# Patient Record
Sex: Female | Born: 1985
Health system: Southern US, Community
[De-identification: ages and names within clinical notes are randomized; demographics above are authoritative.]

## PROBLEM LIST (undated history)

## (undated) ENCOUNTER — Inpatient Hospital Stay (HOSPITAL_COMMUNITY): Payer: Self-pay

## (undated) DIAGNOSIS — D573 Sickle-cell trait: Secondary | ICD-10-CM

## (undated) DIAGNOSIS — O139 Gestational [pregnancy-induced] hypertension without significant proteinuria, unspecified trimester: Secondary | ICD-10-CM

## (undated) DIAGNOSIS — D649 Anemia, unspecified: Secondary | ICD-10-CM

## (undated) DIAGNOSIS — E876 Hypokalemia: Secondary | ICD-10-CM

## (undated) DIAGNOSIS — R87629 Unspecified abnormal cytological findings in specimens from vagina: Secondary | ICD-10-CM

## (undated) HISTORY — PX: NO PAST SURGERIES: SHX2092

## (undated) HISTORY — DX: Unspecified abnormal cytological findings in specimens from vagina: R87.629

---

## 2013-03-17 ENCOUNTER — Other Ambulatory Visit (HOSPITAL_COMMUNITY)
Admission: RE | Admit: 2013-03-17 | Discharge: 2013-03-17 | Disposition: A | Discharge status: Home / Self Care | Payer: PRIVATE HEALTH INSURANCE | Source: Ambulatory Visit | Attending: Obstetrics and Gynecology | Admitting: Obstetrics and Gynecology

## 2013-03-17 DIAGNOSIS — Z113 Encounter for screening for infections with a predominantly sexual mode of transmission: Secondary | ICD-10-CM | POA: Insufficient documentation

## 2013-03-17 DIAGNOSIS — Z01419 Encounter for gynecological examination (general) (routine) without abnormal findings: Secondary | ICD-10-CM | POA: Insufficient documentation

## 2013-03-17 LAB — OB RESULTS CONSOLE HEPATITIS B SURFACE ANTIGEN: Hepatitis B Surface Ag: NEGATIVE

## 2013-03-17 LAB — OB RESULTS CONSOLE ABO/RH: RH Type: POSITIVE

## 2013-03-17 LAB — OB RESULTS CONSOLE GC/CHLAMYDIA
CHLAMYDIA, DNA PROBE: NEGATIVE
Gonorrhea: NEGATIVE

## 2013-03-17 LAB — OB RESULTS CONSOLE HIV ANTIBODY (ROUTINE TESTING): HIV: NONREACTIVE

## 2013-03-17 LAB — OB RESULTS CONSOLE RPR: RPR: NONREACTIVE

## 2013-03-17 LAB — OB RESULTS CONSOLE ANTIBODY SCREEN: Antibody Screen: NEGATIVE

## 2013-03-17 LAB — OB RESULTS CONSOLE RUBELLA ANTIBODY, IGM: Rubella: IMMUNE

## 2013-04-14 ENCOUNTER — Ambulatory Visit (HOSPITAL_COMMUNITY)
Admission: RE | Admit: 2013-04-14 | Discharge: 2013-04-14 | Disposition: A | Discharge status: Home / Self Care | Payer: PRIVATE HEALTH INSURANCE | Source: Ambulatory Visit | Attending: Obstetrics and Gynecology | Admitting: Obstetrics and Gynecology

## 2013-04-14 ENCOUNTER — Other Ambulatory Visit: Payer: Self-pay | Admitting: Obstetrics and Gynecology

## 2013-04-14 DIAGNOSIS — N889 Noninflammatory disorder of cervix uteri, unspecified: Secondary | ICD-10-CM

## 2013-04-14 DIAGNOSIS — Z3689 Encounter for other specified antenatal screening: Secondary | ICD-10-CM | POA: Insufficient documentation

## 2013-04-14 DIAGNOSIS — O209 Hemorrhage in early pregnancy, unspecified: Secondary | ICD-10-CM | POA: Insufficient documentation

## 2013-07-21 ENCOUNTER — Other Ambulatory Visit: Payer: Self-pay | Admitting: Obstetrics and Gynecology

## 2013-07-27 ENCOUNTER — Ambulatory Visit (HOSPITAL_COMMUNITY): Payer: PRIVATE HEALTH INSURANCE

## 2013-07-27 ENCOUNTER — Ambulatory Visit (HOSPITAL_COMMUNITY)
Admission: RE | Admit: 2013-07-27 | Discharge: 2013-07-27 | Disposition: A | Discharge status: Home / Self Care | Payer: PRIVATE HEALTH INSURANCE | Source: Ambulatory Visit | Attending: Obstetrics and Gynecology | Admitting: Obstetrics and Gynecology

## 2013-07-27 DIAGNOSIS — O3660X0 Maternal care for excessive fetal growth, unspecified trimester, not applicable or unspecified: Secondary | ICD-10-CM | POA: Insufficient documentation

## 2013-07-29 NOTE — L&D Delivery Note (Signed)
Delivery Note At 6:50 AM a viable female was delivered via Vaginal, Spontaneous Delivery (Presentation: Left Occiput Anterior).  APGAR: 9, 9; weight .   Placenta status: Intact, Spontaneous Pathology.  Cord: 3 vessels with the following complications: None.  Cord pH: NA  Anesthesia: Epidural  Episiotomy: None Lacerations: None Suture Repair: NA Est. Blood Loss (mL): 250  Mom to postpartum.  Baby to Couplet care / Skin to Skin.  Kimbery Harwood J. 09/14/2013, 7:08 AM

## 2013-09-03 ENCOUNTER — Inpatient Hospital Stay (HOSPITAL_COMMUNITY)
Admission: AD | Admit: 2013-09-03 | Discharge: 2013-09-03 | Disposition: A | Discharge status: Home / Self Care | Payer: Commercial Managed Care - PPO | Source: Ambulatory Visit | Attending: Obstetrics and Gynecology | Admitting: Obstetrics and Gynecology

## 2013-09-03 ENCOUNTER — Encounter (HOSPITAL_COMMUNITY): Payer: Self-pay | Admitting: General Practice

## 2013-09-03 DIAGNOSIS — O139 Gestational [pregnancy-induced] hypertension without significant proteinuria, unspecified trimester: Secondary | ICD-10-CM

## 2013-09-03 DIAGNOSIS — O133 Gestational [pregnancy-induced] hypertension without significant proteinuria, third trimester: Secondary | ICD-10-CM

## 2013-09-03 LAB — CBC
HEMATOCRIT: 33.3 % — AB (ref 36.0–46.0)
HEMOGLOBIN: 11.4 g/dL — AB (ref 12.0–15.0)
MCH: 27.9 pg (ref 26.0–34.0)
MCHC: 34.2 g/dL (ref 30.0–36.0)
MCV: 81.6 fL (ref 78.0–100.0)
Platelets: 175 10*3/uL (ref 150–400)
RBC: 4.08 MIL/uL (ref 3.87–5.11)
RDW: 16.1 % — ABNORMAL HIGH (ref 11.5–15.5)
WBC: 10.7 10*3/uL — ABNORMAL HIGH (ref 4.0–10.5)

## 2013-09-03 LAB — COMPREHENSIVE METABOLIC PANEL
ALBUMIN: 2.7 g/dL — AB (ref 3.5–5.2)
ALK PHOS: 126 U/L — AB (ref 39–117)
ALT: 9 U/L (ref 0–35)
AST: 13 U/L (ref 0–37)
BUN: 5 mg/dL — AB (ref 6–23)
CHLORIDE: 101 meq/L (ref 96–112)
CO2: 23 mEq/L (ref 19–32)
Calcium: 9.1 mg/dL (ref 8.4–10.5)
Creatinine, Ser: 0.58 mg/dL (ref 0.50–1.10)
GFR calc non Af Amer: >90 mL/min (ref >90)
Glucose, Bld: 81 mg/dL (ref 70–99)
POTASSIUM: 3.2 meq/L — AB (ref 3.7–5.3)
Sodium: 136 mEq/L — ABNORMAL LOW (ref 137–147)
Total Bilirubin: 0.5 mg/dL (ref 0.3–1.2)
Total Protein: 6.7 g/dL (ref 6.0–8.3)

## 2013-09-03 LAB — URINALYSIS, DIPSTICK ONLY
BILIRUBIN URINE: NEGATIVE
GLUCOSE, UA: NEGATIVE mg/dL
Hgb urine dipstick: NEGATIVE
KETONES UR: NEGATIVE mg/dL
Nitrite: NEGATIVE
PROTEIN: NEGATIVE mg/dL
Specific Gravity, Urine: <1.005 — ABNORMAL LOW (ref 1.005–1.030)
Urobilinogen, UA: 0.2 mg/dL (ref 0.0–1.0)
pH: 6.5 (ref 5.0–8.0)

## 2013-09-03 LAB — LACTATE DEHYDROGENASE: LDH: 197 U/L (ref 94–250)

## 2013-09-03 LAB — URIC ACID: Uric Acid, Serum: 5 mg/dL (ref 2.4–7.0)

## 2013-09-03 NOTE — MAU Note (Signed)
Pt presents to MAU sent from office to [facility].

## 2013-09-03 NOTE — Progress Notes (Signed)
BP in office 138/98, repeat 140/100.

## 2013-09-03 NOTE — MAU Provider Note (Signed)
History     CSN: [id number]  Arrival date and time: 09/03/13 0927   None     Chief Complaint  Patient presents with  . Hypertension   Hypertension Associated symptoms include headaches (mild). Pertinent negatives include no blurred vision, chest pain or malaise/fatigue.   This is a 28 y.o. female who presents from office with elevated BP.  Ordered to have monitoring and labs. Denies symptoms other than mild headache.    RN Note: Sent from office, BP has been elevated. Had 24hr urine test collected this week,<300 of protein per Dr Dion BodyVarnado. Pt for labs, serial BP and monitoring. occ mild HA, doesn't require meds; no visual changes, epigastric pain or increase in swelling.       OB History   Grav Para Term Preterm Abortions TAB SAB Ect Mult Living   2    1  1          History reviewed. No pertinent past medical history.  History reviewed. No pertinent past surgical history.  History reviewed. No pertinent family history.  History  Substance Use Topics  . Smoking status: Never Smoker   . Smokeless tobacco: Never Used  . Alcohol Use: No    Allergies:  Allergies  Allergen Reactions  . Sulfa Antibiotics Other (See Comments)    Child hood allergy, unknown reaction    Prescriptions prior to admission  Medication Sig Dispense Refill  . calcium carbonate (TUMS - DOSED IN MG ELEMENTAL CALCIUM) 500 MG chewable tablet Chew 1 tablet by mouth 2 (two) times daily as needed for indigestion or heartburn.      . ferrous sulfate 325 (65 FE) MG tablet Take 325 mg by mouth daily with breakfast.      . Prenatal Vit-Fe Fumarate-FA (PRENATAL MULTIVITAMIN) TABS tablet Take 1 tablet by mouth daily at 12 noon.        Review of Systems  Constitutional: Negative for fever, chills and malaise/fatigue.  Eyes: Negative for blurred vision.  Cardiovascular: Negative for chest pain.  Gastrointestinal: Negative for nausea, vomiting and abdominal pain.  Neurological: Positive for headaches  (mild). Negative for dizziness and focal weakness.   Physical Exam   Blood pressure 132/87, pulse 86, temperature 98.3 F (36.8 C), temperature source Oral, resp. rate 18, height 5' (1.524 m), weight 79.379 kg (175 lb), last menstrual period 12/28/2012.  Physical Exam  Constitutional: She is oriented to person, place, and time. She appears well-developed and well-nourished. No distress.  HENT:  Head: Normocephalic.  Neck: Normal range of motion. Neck supple.  Cardiovascular: Normal rate, regular rhythm and normal heart sounds.  Exam reveals no gallop and no friction rub.   No murmur heard. Respiratory: Effort normal. No respiratory distress. She has no wheezes. She has no rales.  GI: Soft. She exhibits no distension. There is no tenderness. There is no rebound and no guarding.  Musculoskeletal: Normal range of motion. She exhibits no edema.  Neurological: She is alert and oriented to person, place, and time. She has normal reflexes. She displays normal reflexes. She exhibits normal muscle tone (no clonus).  Skin: Skin is warm and dry.  Psychiatric: She has a normal mood and affect.    MAU Course  Procedures  MDM Filed Vitals:   09/03/13 1021 09/03/13 1031 09/03/13 1041 09/03/13 1051  BP: 139/91 126/88 126/99 123/78  Pulse: 82 76 87 74  Temp:      TempSrc:      Resp:      Height:  Weight:       Results for orders placed during the hospital encounter of 09/03/13 (from the past 24 hour(s))  URINALYSIS, DIPSTICK ONLY     Status: Abnormal   Collection Time    09/03/13  9:40 AM      Result Value Range   Specific Gravity, Urine <1.005 (*) 1.005 - 1.030   pH 6.5  5.0 - 8.0   Glucose, UA NEGATIVE  NEGATIVE mg/dL   Hgb urine dipstick NEGATIVE  NEGATIVE   Bilirubin Urine NEGATIVE  NEGATIVE   Ketones, ur NEGATIVE  NEGATIVE mg/dL   Protein, ur NEGATIVE  NEGATIVE mg/dL   Urobilinogen, UA 0.2  0.0 - 1.0 mg/dL   Nitrite NEGATIVE  NEGATIVE   Leukocytes, UA TRACE (*) NEGATIVE   CBC     Status: Abnormal   Collection Time    09/03/13 10:26 AM      Result Value Range   WBC 10.7 (*) 4.0 - 10.5 K/uL   RBC 4.08  3.87 - 5.11 MIL/uL   Hemoglobin 11.4 (*) 12.0 - 15.0 g/dL   HCT 56.2 (*) 13.0 - 86.5 %   MCV 81.6  78.0 - 100.0 fL   MCH 27.9  26.0 - 34.0 pg   MCHC 34.2  30.0 - 36.0 g/dL   RDW 78.4 (*) 69.6 - 29.5 %   Platelets 175  150 - 400 K/uL  COMPREHENSIVE METABOLIC PANEL     Status: Abnormal   Collection Time    09/03/13 10:26 AM      Result Value Range   Sodium 136 (*) 137 - 147 mEq/L   Potassium 3.2 (*) 3.7 - 5.3 mEq/L   Chloride 101  96 - 112 mEq/L   CO2 23  19 - 32 mEq/L   Glucose, Bld 81  70 - 99 mg/dL   BUN 5 (*) 6 - 23 mg/dL   Creatinine, Ser 2.84  0.50 - 1.10 mg/dL   Calcium 9.1  8.4 - 13.2 mg/dL   Total Protein 6.7  6.0 - 8.3 g/dL   Albumin 2.7 (*) 3.5 - 5.2 g/dL   AST 13  0 - 37 U/L   ALT 9  0 - 35 U/L   Alkaline Phosphatase 126 (*) 39 - 117 U/L   Total Bilirubin 0.5  0.3 - 1.2 mg/dL   GFR calc non Af Amer >90  >90 mL/min   GFR calc Af Amer >90  >90 mL/min  LACTATE DEHYDROGENASE     Status: None   Collection Time    09/03/13 10:26 AM      Result Value Range   LDH 197  94 - 250 U/L  URIC ACID     Status: None   Collection Time    09/03/13 10:26 AM      Result Value Range   Uric Acid, Serum 5.0  2.4 - 7.0 mg/dL      Assessment and Plan  A:  [facility] at [redacted]w[redacted]d        Gestational hypertension, labile        No evidence of acute preeclampsia  P:  Discussed with Dr Dion Body       May need to do 24 hr urine this weekend       Preeclampsia precautions       Followup inoffice  [facility] 09/03/2013, 10:23 AM

## 2013-09-03 NOTE — MAU Note (Addendum)
Sent from office, BP has been elevated. Had 24hr urine test collected this week,<300 of protein per Dr Dion BodyVarnado.  Pt for labs, serial BP and monitoring. occ mild HA, doesn't require meds; no visual changes, epigastric pain or increase in swelling.

## 2013-09-03 NOTE — Discharge Instructions (Signed)
Hypertension During Pregnancy Hypertension is also called high blood pressure. It can occur at any time in life and during pregnancy. When you have hypertension, there is extra pressure inside your blood vessels that carry blood from the heart to the rest of your body (arteries). Hypertension during pregnancy can cause problems for you and your baby. Your baby might not weigh as much as it should at birth or might be born early (premature). Very bad cases of hypertension during pregnancy can be life threatening.  Different types of hypertension can occur during pregnancy.   Chronic hypertension. This happens when a woman has hypertension before pregnancy and it continues during pregnancy.  Gestational hypertension. This is when hypertension develops during pregnancy.  Preeclampsia or toxemia of pregnancy. This is a very serious type of hypertension that develops only during pregnancy. It is a disease that affects the whole body (systemic) and can be very dangerous for both mother and baby.  Gestational hypertension and preeclampsia usually go away after your baby is born. Blood pressure generally stabilizes within 6 weeks. Women who have hypertension during pregnancy have a greater chance of developing hypertension later in life or with future pregnancies. RISK FACTORS Some factors make you more likely to develop hypertension during pregnancy. Risk factors include:  Having hypertension before pregnancy.  Having hypertension during a previous pregnancy.  Being overweight.  Being older than 40.  Being pregnant with more than one baby (multiples).  Having diabetes or kidney problems. SIGNS AND SYMPTOMS Chronic and gestational hypertension rarely cause symptoms. Preeclampsia has symptoms, which may include:  Increased protein in your urine. Your health care provider will check for this at every prenatal visit.  Swelling of your hands and face.  Rapid weight gain.  Headaches.  Visual  changes.  Being bothered by light.  Abdominal pain, especially in the right upper area.  Chest pain.  Shortness of breath.  Increased reflexes.  Seizures. Seizures occur with a more severe form of preeclampsia, called eclampsia. DIAGNOSIS   You may be diagnosed with hypertension during a regular prenatal exam. At each visit, tests may include:  Blood pressure checks.  A urine test to check for protein in your urine.  The type of hypertension you are diagnosed with depends on when you developed it. It also depends on your specific blood pressure reading.  Developing hypertension before 20 weeks of pregnancy is consistent with chronic hypertension.  Developing hypertension after 20 weeks of pregnancy is consistent with gestational hypertension.  Hypertension with increased urinary protein is diagnosed as preeclampsia.  Blood pressure measurements that stay above 160 systolic or 110 diastolic are a sign of severe preeclampsia. TREATMENT Treatment for hypertension during pregnancy varies. Treatment depends on the type of hypertension and how serious it is.  If you take medicine for chronic hypertension, you may need to switch medicines.  Drugs called ACE inhibitors should not be taken during pregnancy.  Low-dose aspirin may be suggested for women who have risk factors for preeclampsia.  If you have gestational hypertension, you may need to take a blood pressure medicine that is safe during pregnancy. Your health care provider will recommend the appropriate medicine.  If you have severe preeclampsia, you may need to be in the hospital. Health care providers will watch you and the baby very closely. You also may need to take medicine (magnesium sulfate) to prevent seizures and lower blood pressure.  Sometimes an early delivery is needed. This may be the case if the condition worsens. It would   be done to protect you and the baby. The only cure for preeclampsia is delivery. HOME  CARE INSTRUCTIONS  Schedule and keep all of your regular appointments for prenatal care.  Only take over-the-counter or prescription medicines as directed by your health care provider. Tell your health care provider about all medicines you take.  Eat as little salt as possible.  Get regular exercise.  Do not drink alcohol.  Do not use tobacco products.  Do not drink products with caffeine.  Lie on your left side when resting. SEEK IMMEDIATE MEDICAL CARE IF:  You have severe abdominal pain.  You have sudden swelling in the hands, ankles, or face.  You gain 4 pounds (1.8 kg) or more in 1 week.  You vomit repeatedly.  You have vaginal bleeding.  You do not feel the baby moving as much.  You have a headache.  You have blurred or double vision.  You have muscle twitching or spasms.  You have shortness of breath.  You have blue fingernails and lips.  You have blood in your urine. MAKE SURE YOU:  Understand these instructions.  Will watch your condition.  Will get help right away if you are not doing well or get worse. Document Released: 04/02/2011 Document Revised: 05/05/2013 Document Reviewed: 02/11/2013 [employer] Patient Information 2014 [employer]  

## 2013-09-04 ENCOUNTER — Ambulatory Visit (HOSPITAL_COMMUNITY)
Admission: AD | Admit: 2013-09-04 | Discharge: 2013-09-04 | Disposition: A | Discharge status: Home / Self Care | Payer: Commercial Managed Care - PPO | Source: Ambulatory Visit | Attending: Obstetrics and Gynecology | Admitting: Obstetrics and Gynecology

## 2013-09-04 DIAGNOSIS — O26839 Pregnancy related renal disease, unspecified trimester: Secondary | ICD-10-CM | POA: Insufficient documentation

## 2013-09-05 ENCOUNTER — Other Ambulatory Visit (HOSPITAL_COMMUNITY): Payer: Self-pay | Admitting: *Deleted

## 2013-09-05 LAB — PROTEIN, URINE, 24 HOUR
Collection Interval-UPROT: 24 hours
PROTEIN 24H UR: 72 mg/d (ref 50–100)
PROTEIN, URINE: 3 mg/dL
Urine Total Volume-UPROT: 2400 mL

## 2013-09-06 ENCOUNTER — Encounter (HOSPITAL_COMMUNITY): Payer: Self-pay

## 2013-09-06 ENCOUNTER — Other Ambulatory Visit: Payer: Self-pay | Admitting: Obstetrics and Gynecology

## 2013-09-06 ENCOUNTER — Inpatient Hospital Stay (HOSPITAL_COMMUNITY)
Admission: AD | Admit: 2013-09-06 | Discharge: 2013-09-06 | Disposition: A | Discharge status: Home / Self Care | Payer: Commercial Managed Care - PPO | Source: Ambulatory Visit | Attending: Obstetrics and Gynecology | Admitting: Obstetrics and Gynecology

## 2013-09-06 ENCOUNTER — Ambulatory Visit (HOSPITAL_COMMUNITY)
Admission: RE | Admit: 2013-09-06 | Discharge: 2013-09-06 | Disposition: A | Discharge status: Home / Self Care | Payer: Commercial Managed Care - PPO | Source: Ambulatory Visit | Attending: Obstetrics and Gynecology | Admitting: Obstetrics and Gynecology

## 2013-09-06 DIAGNOSIS — O288 Other abnormal findings on antenatal screening of mother: Secondary | ICD-10-CM

## 2013-09-06 DIAGNOSIS — O289 Unspecified abnormal findings on antenatal screening of mother: Secondary | ICD-10-CM | POA: Insufficient documentation

## 2013-09-06 DIAGNOSIS — O99019 Anemia complicating pregnancy, unspecified trimester: Secondary | ICD-10-CM | POA: Insufficient documentation

## 2013-09-06 DIAGNOSIS — O139 Gestational [pregnancy-induced] hypertension without significant proteinuria, unspecified trimester: Secondary | ICD-10-CM | POA: Insufficient documentation

## 2013-09-06 DIAGNOSIS — O133 Gestational [pregnancy-induced] hypertension without significant proteinuria, third trimester: Secondary | ICD-10-CM

## 2013-09-06 DIAGNOSIS — D573 Sickle-cell trait: Secondary | ICD-10-CM | POA: Insufficient documentation

## 2013-09-06 HISTORY — DX: Gestational (pregnancy-induced) hypertension without significant proteinuria, unspecified trimester: O13.9

## 2013-09-06 HISTORY — DX: Sickle-cell trait: D57.3

## 2013-09-06 HISTORY — DX: Anemia, unspecified: D64.9

## 2013-09-06 HISTORY — DX: Hypokalemia: E87.6

## 2013-09-06 NOTE — MAU Note (Signed)
Sent from [facility]FM, tracing non-reactive; BPP 6/8

## 2013-09-06 NOTE — Discharge Instructions (Signed)
Hypertension During Pregnancy °Hypertension is also called high blood pressure. It can occur at any time in life and during pregnancy. When you have hypertension, there is extra pressure inside your blood vessels that carry blood from the heart to the rest of your body (arteries). Hypertension during pregnancy can cause problems for you and your baby. Your baby might not weigh as much as it should at birth or might be born early (premature). Very bad cases of hypertension during pregnancy can be life threatening.  °Different types of hypertension can occur during pregnancy.  °· Chronic hypertension. This happens when a woman has hypertension before pregnancy and it continues during pregnancy. °· Gestational hypertension. This is when hypertension develops during pregnancy. °· Preeclampsia or toxemia of pregnancy. This is a very serious type of hypertension that develops only during pregnancy. It is a disease that affects the whole body (systemic) and can be very dangerous for both mother and baby.   °Gestational hypertension and preeclampsia usually go away after your baby is born. Blood pressure generally stabilizes within 6 weeks. Women who have hypertension during pregnancy have a greater chance of developing hypertension later in life or with future pregnancies. °RISK FACTORS °Some factors make you more likely to develop hypertension during pregnancy. Risk factors include: °· Having hypertension before pregnancy. °· Having hypertension during a previous pregnancy. °· Being overweight. °· Being older than 40. °· Being pregnant with more than one baby (multiples). °· Having diabetes or kidney problems. °SIGNS AND SYMPTOMS °Chronic and gestational hypertension rarely cause symptoms. Preeclampsia has symptoms, which may include: °· Increased protein in your urine. Your health care provider will check for this at every prenatal visit. °· Swelling of your hands and face. °· Rapid weight gain. °· Headaches. °· Visual  changes. °· Being bothered by light. °· Abdominal pain, especially in the right upper area. °· Chest pain. °· Shortness of breath. °· Increased reflexes. °· Seizures. Seizures occur with a more severe form of preeclampsia, called eclampsia. °DIAGNOSIS  °· You may be diagnosed with hypertension during a regular prenatal exam. At each visit, tests may include: °· Blood pressure checks. °· A urine test to check for protein in your urine. °· The type of hypertension you are diagnosed with depends on when you developed it. It also depends on your specific blood pressure reading. °· Developing hypertension before 20 weeks of pregnancy is consistent with chronic hypertension. °· Developing hypertension after 20 weeks of pregnancy is consistent with gestational hypertension. °· Hypertension with increased urinary protein is diagnosed as preeclampsia. °· Blood pressure measurements that stay above 160 systolic or 110 diastolic are a sign of severe preeclampsia. °TREATMENT °Treatment for hypertension during pregnancy varies. Treatment depends on the type of hypertension and how serious it is. °· If you take medicine for chronic hypertension, you may need to switch medicines. °· Drugs called ACE inhibitors should not be taken during pregnancy. °· Low-dose aspirin may be suggested for women who have risk factors for preeclampsia. °· If you have gestational hypertension, you may need to take a blood pressure medicine that is safe during pregnancy. Your health care provider will recommend the appropriate medicine. °· If you have severe preeclampsia, you may need to be in the hospital. Health care providers will watch you and the baby very closely. You also may need to take medicine (magnesium sulfate) to prevent seizures and lower blood pressure. °· Sometimes an early delivery is needed. This may be the case if the condition worsens. It would   be done to protect you and the baby. The only cure for preeclampsia is delivery. HOME  CARE INSTRUCTIONS  Schedule and keep all of your regular appointments for prenatal care.  Only take over-the-counter or prescription medicines as directed by your health care provider. Tell your health care provider about all medicines you take.  Eat as little salt as possible.  Get regular exercise.  Do not drink alcohol.  Do not use tobacco products.  Do not drink products with caffeine.  Lie on your left side when resting. SEEK IMMEDIATE MEDICAL CARE IF:  You have severe abdominal pain.  You have sudden swelling in the hands, ankles, or face.  You gain 4 pounds (1.8 kg) or more in 1 week.  You vomit repeatedly.  You have vaginal bleeding.  You do not feel the baby moving as much.  You have a headache.  You have blurred or double vision.  You have muscle twitching or spasms.  You have shortness of breath.  You have blue fingernails and lips.  You have blood in your urine. MAKE SURE YOU:  Understand these instructions.  Will watch your condition.  Will get help right away if you are not doing well or get worse. Document Released: 04/02/2011 Document Revised: 05/05/2013 Document Reviewed: 02/11/2013 [facility] Patient Information 2014 [place], [place].   Fetal Movement Counts Patient Name: __________________________________________________ Patient Due Date: ____________________ Performing a fetal movement count is highly recommended in high-risk pregnancies, but it is good for every pregnant woman to do. Your caregiver may ask you to start counting fetal movements at 28 weeks of the pregnancy. Fetal movements often increase:  After eating a full meal.  After physical activity.  After eating or drinking something sweet or cold.  At rest. Pay attention to when you feel the baby is most active. This will help you notice a pattern of your baby's sleep and wake cycles and what factors contribute to an increase in fetal movement. It is important to perform a  fetal movement count at the same time each day when your baby is normally most active.  HOW TO COUNT FETAL MOVEMENTS 1. Find a quiet and comfortable area to sit or lie down on your left side. Lying on your left side provides the best blood and oxygen circulation to your baby. 2. Write down the day and time on a sheet of paper or in a journal. 3. Start counting kicks, flutters, swishes, rolls, or jabs in a 2 hour period. You should feel at least 10 movements within 2 hours. 4. If you do not feel 10 movements in 2 hours, wait 2 3 hours and count again. Look for a change in the pattern or not enough counts in 2 hours. SEEK MEDICAL CARE IF:  You feel less than 10 counts in 2 hours, tried twice.  There is no movement in over an hour.  The pattern is changing or taking longer each day to reach 10 counts in 2 hours.  You feel the baby is not moving as he or she usually does. Date: ____________ Movements: ____________ Start time: ____________ Doreatha MartinFinish time: ____________  Date: ____________ Movements: ____________ Start time: ____________ Doreatha MartinFinish time: ____________ Date: ____________ Movements: ____________ Start time: ____________ Doreatha MartinFinish time: ____________ Date: ____________ Movements: ____________ Start time: ____________ Doreatha MartinFinish time: ____________ Date: ____________ Movements: ____________ Start time: ____________ Doreatha MartinFinish time: ____________ Date: ____________ Movements: ____________ Start time: ____________ Doreatha MartinFinish time: ____________ Date: ____________ Movements: ____________ Start time: ____________ Doreatha MartinFinish time: ____________ Date: ____________ Movements: ____________ Start  time: ____________ Doreatha MartinFinish time: ____________  Date: ____________ Movements: ____________ Start time: ____________ Doreatha MartinFinish time: ____________ Date: ____________ Movements: ____________ Start time: ____________ Doreatha MartinFinish time: ____________ Date: ____________ Movements: ____________ Start time: ____________ Doreatha MartinFinish time: ____________ Date:  ____________ Movements: ____________ Start time: ____________ Doreatha MartinFinish time: ____________ Date: ____________ Movements: ____________ Start time: ____________ Doreatha MartinFinish time: ____________ Date: ____________ Movements: ____________ Start time: ____________ Doreatha MartinFinish time: ____________ Date: ____________ Movements: ____________ Start time: ____________ Doreatha MartinFinish time: ____________  Date: ____________ Movements: ____________ Start time: ____________ Doreatha MartinFinish time: ____________ Date: ____________ Movements: ____________ Start time: ____________ Doreatha MartinFinish time: ____________ Date: ____________ Movements: ____________ Start time: ____________ Doreatha MartinFinish time: ____________ Date: ____________ Movements: ____________ Start time: ____________ Doreatha MartinFinish time: ____________ Date: ____________ Movements: ____________ Start time: ____________ Doreatha MartinFinish time: ____________ Date: ____________ Movements: ____________ Start time: ____________ Doreatha MartinFinish time: ____________ Date: ____________ Movements: ____________ Start time: ____________ Doreatha MartinFinish time: ____________  Date: ____________ Movements: ____________ Start time: ____________ Doreatha MartinFinish time: ____________ Date: ____________ Movements: ____________ Start time: ____________ Doreatha MartinFinish time: ____________ Date: ____________ Movements: ____________ Start time: ____________ Doreatha MartinFinish time: ____________ Date: ____________ Movements: ____________ Start time: ____________ Doreatha MartinFinish time: ____________ Date: ____________ Movements: ____________ Start time: ____________ Doreatha MartinFinish time: ____________ Date: ____________ Movements: ____________ Start time: ____________ Doreatha MartinFinish time: ____________ Date: ____________ Movements: ____________ Start time: ____________ Doreatha MartinFinish time: ____________  Date: ____________ Movements: ____________ Start time: ____________ Doreatha MartinFinish time: ____________ Date: ____________ Movements: ____________ Start time: ____________ Doreatha MartinFinish time: ____________ Date: ____________ Movements: ____________ Start  time: ____________ Doreatha MartinFinish time: ____________ Date: ____________ Movements: ____________ Start time: ____________ Doreatha MartinFinish time: ____________ Date: ____________ Movements: ____________ Start time: ____________ Doreatha MartinFinish time: ____________ Date: ____________ Movements: ____________ Start time: ____________ Doreatha MartinFinish time: ____________ Date: ____________ Movements: ____________ Start time: ____________ Doreatha MartinFinish time: ____________  Date: ____________ Movements: ____________ Start time: ____________ Doreatha MartinFinish time: ____________ Date: ____________ Movements: ____________ Start time: ____________ Doreatha MartinFinish time: ____________ Date: ____________ Movements: ____________ Start time: ____________ Doreatha MartinFinish time: ____________ Date: ____________ Movements: ____________ Start time: ____________ Doreatha MartinFinish time: ____________ Date: ____________ Movements: ____________ Start time: ____________ Doreatha MartinFinish time: ____________ Date: ____________ Movements: ____________ Start time: ____________ Doreatha MartinFinish time: ____________ Date: ____________ Movements: ____________ Start time: ____________ Doreatha MartinFinish time: ____________  Date: ____________ Movements: ____________ Start time: ____________ Doreatha MartinFinish time: ____________ Date: ____________ Movements: ____________ Start time: ____________ Doreatha MartinFinish time: ____________ Date: ____________ Movements: ____________ Start time: ____________ Doreatha MartinFinish time: ____________ Date: ____________ Movements: ____________ Start time: ____________ Doreatha MartinFinish time: ____________ Date: ____________ Movements: ____________ Start time: ____________ Doreatha MartinFinish time: ____________ Date: ____________ Movements: ____________ Start time: ____________ Doreatha MartinFinish time: ____________ Date: ____________ Movements: ____________ Start time: ____________ Doreatha MartinFinish time: ____________  Date: ____________ Movements: ____________ Start time: ____________ Doreatha MartinFinish time: ____________ Date: ____________ Movements: ____________ Start time: ____________ Doreatha MartinFinish time:  ____________ Date: ____________ Movements: ____________ Start time: ____________ Doreatha MartinFinish time: ____________ Date: ____________ Movements: ____________ Start time: ____________ Doreatha MartinFinish time: ____________ Date: ____________ Movements: ____________ Start time: ____________ Doreatha MartinFinish time: ____________ Date: ____________ Movements: ____________ Start time: ____________ Doreatha MartinFinish time: ____________ Document Released: 08/14/2006 Document Revised: 07/01/2012 Document Reviewed: 05/11/2012 [employer] Patient Information 2014 [place], [place].

## 2013-09-06 NOTE — MAU Note (Signed)
Pt states sent from office for NRNST, BPP 6/8. Denies bleeding or lof. Has had issues with htn w/pregnancy only. Was seen in MAU a week ago for PIH eval.

## 2013-09-06 NOTE — MAU Provider Note (Signed)
Chief Complaint:  non-reactive fetal heart    First Provider Initiated Contact with Patient 09/06/13 1648      HPI: Linda Cole is a 28 y.o. G2P0010 at [redacted]w[redacted]d who presents from [place] to maternity admissions due to 6/10 BPP. Being followed for GHTN with normal preE labs 09/03/13. Had NR NST in office today. Had normal 24 hour urine (pro 72mg ) 2 days ago.  Denies H/A, visual disturbance or epigastric pain. Denies contractions, leakage of fluid or vaginal bleeding. Good fetal movement.   Pregnancy Course: Essentially uncomplicated except as noted above.   First tri BP baseline 114/76.   Past Medical History: Past Medical History  Diagnosis Date  . Pregnancy induced hypertension   . Anemia   . Hypokalemia   . Sickle cell trait     Past obstetric history: OB History  Gravida Para Term Preterm AB SAB TAB Ectopic Multiple Living  2    1 1         # Outcome Date GA Lbr Len/2nd Weight Sex Delivery Anes PTL Lv  2 CUR           1 SAB 08/2011              Past Surgical History: Past Surgical History  Procedure Laterality Date  . No past surgeries       Family History: History reviewed. No pertinent family history.  Social History: History  Substance Use Topics  . Smoking status: Never Smoker   . Smokeless tobacco: Never Used  . Alcohol Use: No    Allergies:  Allergies  Allergen Reactions  . Sulfa Antibiotics Other (See Comments)    Child hood allergy, unknown reaction    Meds:  Prescriptions prior to admission  Medication Sig Dispense Refill  . calcium carbonate (TUMS - DOSED IN MG ELEMENTAL CALCIUM) 500 MG chewable tablet Chew 1 tablet by mouth 2 (two) times daily as needed for indigestion or heartburn.      . ferrous sulfate 325 (65 FE) MG tablet Take 325 mg by mouth daily with breakfast.      . Prenatal Vit-Fe Fumarate-FA (PRENATAL MULTIVITAMIN) TABS tablet Take 1 tablet by mouth daily at 12 noon.        ROS: Pertinent findings in history of present  illness.  Physical Exam  Blood pressure 136/87, pulse 80, temperature 98.2 F (36.8 C), temperature source Oral, resp. rate 18, height 5' (1.524 m), last menstrual period 12/28/2012, SpO2 99.00%. GENERAL: Well-developed, well-nourished female in no acute distress.     FHT:  Baseline 140 , moderate variability, accelerations present, no decelerations Contractions: rare, mild   Labs: No results found for this or any previous visit (from the past 24 hour(s)).  Imaging:  US Fetal Bpp W/o Non Stress MFM ultrasound   Indication: 28 yr old G2P0010 at [redacted]w[redacted]d with gestational hypertension for BPP secondary to nonreactive NST. Remote read.   Findings: 1. Single intrauterine pregnancy. 2. Posterior placenta without evidence of previa. 3. Normal amniotic fluid volume. 4. BPP is 6/8 (-2 for breathing).   Recommendations: 1. BPP overall is 6/10 (had nonreactive NST in her doctor's office): - patient was sent to L&D for prolonged monitoring - if fetal tracing is reassuring recommend repeat BPP in 6 hours 2. Gestational hypertension: - recommend close monitoring of BPs - recommend delivery at 37 weeks or sooner if clinically indicated or meets criteria for severe gestational hypertension or severe preeclampsia - recommend continue antenatal testing   Results discussed with Dr. Dion Body.  Linda FosterKristen Quinn, MD       MAU Course: Dr. Dion BodyVarnado here to see pt   Assessment: 1. Gestational hypertension w/o significant proteinuria in 3rd trimester     Plan: Per MD  Linda Cole, CNM 09/06/2013 4:52 PM

## 2013-09-06 NOTE — Progress Notes (Signed)
MFM ultrasound  Indication: 28 yr old G2P0010 at 4855w0d with gestational hypertension for BPP secondary to nonreactive NST. Remote read.  Findings: 1. Single intrauterine pregnancy. 2. Posterior placenta without evidence of previa. 3. Normal amniotic fluid volume. 4. BPP is 6/8 (-2 for breathing).  Recommendations: 1. BPP overall is 6/10 (had nonreactive NST in her doctor's office): - patient was sent to L&D for prolonged monitoring - if fetal tracing is reassuring recommend repeat BPP in 6 hours 2. Gestational hypertension: - recommend close monitoring of BPs - recommend delivery at 37 weeks or sooner if clinically indicated or meets criteria for severe gestational hypertension or severe preeclampsia - recommend continue antenatal testing  Results discussed with Dr. Dion BodyVarnado.   Eulis FosterKristen Kerston Landeck, MD

## 2013-09-07 ENCOUNTER — Other Ambulatory Visit: Payer: Self-pay | Admitting: Obstetrics and Gynecology

## 2013-09-09 ENCOUNTER — Telehealth (HOSPITAL_COMMUNITY): Payer: Self-pay | Admitting: *Deleted

## 2013-09-09 ENCOUNTER — Encounter (HOSPITAL_COMMUNITY): Payer: Self-pay | Admitting: *Deleted

## 2013-09-09 ENCOUNTER — Inpatient Hospital Stay (HOSPITAL_COMMUNITY)
Admission: AD | Admit: 2013-09-09 | Discharge: 2013-09-09 | Disposition: A | Discharge status: Home / Self Care | Payer: Commercial Managed Care - PPO | Source: Ambulatory Visit | Attending: Obstetrics & Gynecology | Admitting: Obstetrics & Gynecology

## 2013-09-09 DIAGNOSIS — D571 Sickle-cell disease without crisis: Secondary | ICD-10-CM | POA: Insufficient documentation

## 2013-09-09 DIAGNOSIS — E876 Hypokalemia: Secondary | ICD-10-CM | POA: Insufficient documentation

## 2013-09-09 DIAGNOSIS — O139 Gestational [pregnancy-induced] hypertension without significant proteinuria, unspecified trimester: Secondary | ICD-10-CM | POA: Insufficient documentation

## 2013-09-09 DIAGNOSIS — O133 Gestational [pregnancy-induced] hypertension without significant proteinuria, third trimester: Secondary | ICD-10-CM

## 2013-09-09 DIAGNOSIS — O99019 Anemia complicating pregnancy, unspecified trimester: Secondary | ICD-10-CM | POA: Insufficient documentation

## 2013-09-09 LAB — URINALYSIS, ROUTINE W REFLEX MICROSCOPIC
Bilirubin Urine: NEGATIVE
GLUCOSE, UA: NEGATIVE mg/dL
HGB URINE DIPSTICK: NEGATIVE
Ketones, ur: NEGATIVE mg/dL
LEUKOCYTES UA: NEGATIVE
Nitrite: NEGATIVE
PH: 6 (ref 5.0–8.0)
Protein, ur: NEGATIVE mg/dL
SPECIFIC GRAVITY, URINE: 1.01 (ref 1.005–1.030)
Urobilinogen, UA: 0.2 mg/dL (ref 0.0–1.0)

## 2013-09-09 NOTE — Discharge Instructions (Signed)
Hypertension During Pregnancy °Hypertension is also called high blood pressure. Blood pressure moves blood in your body. Sometimes, the force that moves the blood becomes too strong. When you are pregnant, this condition should be watched carefully. It can cause problems for you and your baby. °HOME CARE  °· Make and keep all of your doctor visits. °· Take medicine as told by your doctor. Tell your doctor about all medicines you take. °· Eat very little salt. °· Exercise regularly. °· Do not drink alcohol. °· Do not smoke. °· Do not have drinks with caffeine. °· Lie on your left side when resting. °GET HELP RIGHT AWAY IF: °· You have bad belly (abdominal) pain. °· You have sudden puffiness (swelling) in the hands, ankles, or face. °· You gain 4 pounds (1.8 kilograms) or more in 1 week. °· You throw up (vomit) repeatedly. °· You have bleeding from the vagina. °· You do not feel the baby moving as much. °· You have a headache. °· You have blurred or double vision. °· You have muscle twitching or spasms. °· You have shortness of breath. °· You have blue fingernails and lips. °· You have blood in your pee (urine). °MAKE SURE YOU: °· Understand these instructions. °· Will watch your condition. °· Will get help right away if you are not doing well or get worse. °Document Released: 08/17/2010 Document Revised: 05/05/2013 Document Reviewed: 02/11/2013 °ExitCare® Patient Information ©2014 [employer]. ° °Preeclampsia and Eclampsia °Preeclampsia is a condition of high blood pressure during pregnancy. It can happen at 20 weeks or later in pregnancy. If high blood pressure occurs in the second half of pregnancy with no other symptoms, it is called gestational hypertension and goes away after the baby is born. If any of the symptoms listed below develop with gestational hypertension, it is then called preeclampsia. Eclampsia (convulsions) may follow preeclampsia. This is one of the reasons for regular prenatal checkups. Early  diagnosis and treatment are very important to prevent eclampsia. °CAUSES  °There is no known cause of preeclampsia/eclampsia in pregnancy. There are several known conditions that may put the pregnant woman at risk, such as: °· The first pregnancy. °· Having preeclampsia in a past pregnancy. °· Having lasting (chronic) high blood pressure. °· Having multiples (twins, triplets). °· Being age 35 or older. °· African American ethnic background. °· Having kidney disease or diabetes. °· Medical conditions such as lupus or blood diseases. °· Being overweight (obese). °SYMPTOMS  °· High blood pressure. °· Headaches. °· Sudden weight gain. °· Swelling of hands, face, legs, and feet. °· Protein in the urine. °· Feeling sick to your stomach (nauseous) and throwing up (vomiting). °· Vision problems (blurred or double vision). °· Numbness in the face, arms, legs, and feet. °· Dizziness. °· Slurred speech. °· Preeclampsia can cause growth retardation in the fetus. °· Separation (abruption) of the placenta. °· Not enough fluid in the amniotic sac (oligohydramnios). °· Sensitivity to bright lights. °· Belly (abdominal) pain. °DIAGNOSIS  °If protein is found in the urine in the second half of pregnancy, this is considered preeclampsia. Other symptoms mentioned above may also be present. °TREATMENT  °It is necessary to treat this. °· Your caregiver may prescribe bed rest early in this condition. Plenty of rest and salt restriction may be all that is needed. °· Medicines may be necessary to lower blood pressure if the condition does not respond to more conservative measures. °· In more severe cases, hospitalization may be needed: °· For treatment of   blood pressure. °· To control fluid retention. °· To monitor the baby to see if the condition is causing harm to the baby. °· Hospitalization is the best way to treat the first sign of preeclampsia. This is so the mother and baby can be watched closely and blood tests can be done  effectively and correctly. °· If the condition becomes severe, it may be necessary to induce labor or to remove the infant by surgical means (cesarean section). The best cure for preeclampsia/eclampsia is to deliver the baby. °Preeclampsia and eclampsia involve risks to mother and infant. Your caregiver will discuss these risks with you. Together, you can work out the best possible approach to your problems. Make sure you keep your prenatal visits as scheduled. Not keeping appointments could result in a chronic or permanent injury, pain, disability to you, and death or injury to you or your unborn baby. If there is any problem keeping the appointment, you must call to reschedule. °HOME CARE INSTRUCTIONS  °· Keep your prenatal appointments and tests as scheduled. °· Tell your caregiver if you have any of the above risk factors. °· Get plenty of rest and sleep. °· Eat a balanced diet that is low in salt, and do not add salt to your food. °· Avoid stressful situations. °· Only take over-the-counter and prescriptions medicines for pain, discomfort, or fever as directed by your caregiver. °SEEK IMMEDIATE MEDICAL CARE IF:  °· You develop severe swelling anywhere in the body. This usually occurs in the legs. °· You gain 05 lb/2.3 kg or more in a week. °· You develop a severe headache, dizziness, problems with your vision, or confusion. °· You have abdominal pain, nausea, or vomiting. °· You have a seizure. °· You have trouble moving any part of your body, or you develop numbness or problems speaking. °· You have bruising or abnormal bleeding from anywhere in the body. °· You develop a stiff neck. °· You pass out. °MAKE SURE YOU:  °· Understand these instructions. °· Will watch your condition. °· Will get help right away if you are not doing well or get worse. °Document Released: 07/12/2000 Document Revised: 10/07/2011 Document Reviewed: 02/26/2008 °ExitCare® Patient Information ©2014 [employer]. ° °

## 2013-09-09 NOTE — Telephone Encounter (Signed)
Preadmission screen  

## 2013-09-09 NOTE — MAU Note (Addendum)
PT SAYS SHE  WAS ASLEEP AND THEN AWOKE - 840PM -  BP WAS 152/110  AND THEN AT 850PM -  137/100.  SAYS SHE HAS H/A -  STARTED   AT 7PM.  SAYS  BP HAS BEEN ELEVATED IN OFFICE-  NO MEDS.     SAYS NOW SWELLING IN FEET.     [facility]-   DENIES HSV AND MRSA.   [facility]  [facility] AT 0730-  BECAUSE OF BP.

## 2013-09-09 NOTE — MAU Note (Signed)
Pt woke up from a nap with a headache and not feeling herself.Pt states she called the on call person and was told to retake her BP which went down a  Little.

## 2013-09-09 NOTE — MAU Provider Note (Signed)
History     CSN: [id number]  Arrival date and time: 09/09/13 2235   First Provider Initiated Contact with Patient 09/09/13 2316      Chief Complaint  Patient presents with  . Hypertension   HPI Ms. Linda Cole is a 28 y.o. G2P0010 at [redacted]w[redacted]d who presents to MAU today with complaint of elevated BP at home. The patient has induction scheduled on Monday for [facility]. She states that she had headache earlier and check BP at home and it was 152/100 and 134/100. Her BPs have been 140s/90s in the office x 2 weeks. She states headache resolved. She denies RUQ pain, peripheral edema, blurred vision or floaters. She is having occasional mild contractions. She denies vaginal bleeding, LOF today. She reports good fetal movement.   OB History   Grav Para Term Preterm Abortions TAB SAB Ect Mult Living   2    1  1          Past Medical History  Diagnosis Date  . Pregnancy induced hypertension   . Anemia   . Hypokalemia   . Sickle cell trait   . Vaginal Pap smear, abnormal     Past Surgical History  Procedure Laterality Date  . No past surgeries      Family History  Problem Relation Age of Onset  . Diabetes Maternal Grandfather   . Hypertension Paternal Grandmother   . Diabetes Paternal Grandmother   . COPD Paternal Grandmother   . Hypertension Paternal Grandfather   . Diabetes Paternal Grandfather   . COPD Paternal Grandfather     History  Substance Use Topics  . Smoking status: Never Smoker   . Smokeless tobacco: Never Used  . Alcohol Use: No    Allergies:  Allergies  Allergen Reactions  . Sulfa Antibiotics Other (See Comments)    Child hood allergy, unknown reaction    Prescriptions prior to admission  Medication Sig Dispense Refill  . calcium carbonate (TUMS - DOSED IN MG ELEMENTAL CALCIUM) 500 MG chewable tablet Chew 1 tablet by mouth 2 (two) times daily as needed for indigestion or heartburn.      . ferrous sulfate 325 (65 FE) MG tablet Take 325 mg by mouth daily  with breakfast.      . Prenatal Vit-Fe Fumarate-FA (PRENATAL MULTIVITAMIN) TABS tablet Take 1 tablet by mouth daily at 12 noon.        Review of Systems  Constitutional: Negative for malaise/fatigue.  Eyes: Negative for blurred vision and double vision.  Gastrointestinal: Negative for abdominal pain.  Genitourinary:       Neg - vaginal bleeding, LOF  Neurological: Negative for headaches.   Physical Exam   Blood pressure 129/78, pulse 82, temperature 98.5 F (36.9 C), temperature source Oral, resp. rate 20, height 5' (1.524 m), weight 176 lb (79.833 kg), last menstrual period 12/28/2012.  Physical Exam  Constitutional: She is oriented to person, place, and time. She appears well-developed and well-nourished. No distress.  HENT:  Head: Normocephalic and atraumatic.  Cardiovascular: Normal rate and normal heart sounds.   Respiratory: Effort normal and breath sounds normal. No respiratory distress.  GI: Soft. She exhibits no distension and no mass. There is no tenderness. There is no rebound and no guarding.  Neurological: She is alert and oriented to person, place, and time.  Skin: Skin is warm and dry. No erythema.  Psychiatric: She has a normal mood and affect.   Results for orders placed during the hospital encounter of 09/09/13 (from the past  24 hour(s))  URINALYSIS, ROUTINE W REFLEX MICROSCOPIC     Status: None   Collection Time    09/09/13 10:49 PM      Result Value Ref Range   Color, Urine YELLOW  YELLOW   APPearance CLEAR  CLEAR   Specific Gravity, Urine 1.010  1.005 - 1.030   pH 6.0  5.0 - 8.0   Glucose, UA NEGATIVE  NEGATIVE mg/dL   Hgb urine dipstick NEGATIVE  NEGATIVE   Bilirubin Urine NEGATIVE  NEGATIVE   Ketones, ur NEGATIVE  NEGATIVE mg/dL   Protein, ur NEGATIVE  NEGATIVE mg/dL   Urobilinogen, UA 0.2  0.0 - 1.0 mg/dL   Nitrite NEGATIVE  NEGATIVE   Leukocytes, UA NEGATIVE  NEGATIVE   Patient Vitals for the past 24 hrs:  BP Temp Temp src Pulse Resp Height  Weight  09/09/13 2333 129/78 mmHg - - 82 - - -  09/09/13 2318 136/82 mmHg - - 84 - - -  09/09/13 2304 132/98 mmHg - - 90 - - -  09/09/13 2303 139/82 mmHg - - 88 - - -  09/09/13 2247 126/84 mmHg 98.5 F (36.9 C) Oral 74 20 5' (1.524 m) 176 lb (79.833 kg)    Fetal Monitoring: Baseline: 140 bpm, moderate variability, + accelerations, no decelerations Contractions: none MAU Course  Procedures None  MDM Discussed patient status, BPs and lab results with Dr. Charlotta Newtonzan. Ok for discharge with reassurance. Follow-up as scheduled. Discuss warning signs for pre-eclampsia.   Assessment and Plan  A: GHTN  P: Discharge home Warning signs for pre-eclampsia discussed Patient advised [facility] Patient may return to MAU as needed or if her condition were to change or worsen  Freddi StarrJulie N Ethier, PA-C  09/09/2013, 11:37 PM

## 2013-09-13 ENCOUNTER — Other Ambulatory Visit: Payer: Self-pay | Admitting: Obstetrics and Gynecology

## 2013-09-13 ENCOUNTER — Encounter (HOSPITAL_COMMUNITY): Payer: Self-pay

## 2013-09-13 ENCOUNTER — Inpatient Hospital Stay (HOSPITAL_COMMUNITY): Payer: Commercial Managed Care - PPO | Admitting: Anesthesiology

## 2013-09-13 ENCOUNTER — Encounter (HOSPITAL_COMMUNITY): Payer: Commercial Managed Care - PPO | Admitting: Anesthesiology

## 2013-09-13 ENCOUNTER — Inpatient Hospital Stay (HOSPITAL_COMMUNITY)
Admission: RE | Admit: 2013-09-13 | Discharge: 2013-09-16 | DRG: 775 | Disposition: A | Discharge status: Home / Self Care | Payer: Commercial Managed Care - PPO | Source: Ambulatory Visit | Attending: Obstetrics and Gynecology | Admitting: Obstetrics and Gynecology

## 2013-09-13 DIAGNOSIS — O9902 Anemia complicating childbirth: Secondary | ICD-10-CM | POA: Diagnosis present

## 2013-09-13 DIAGNOSIS — Z22330 Carrier of Group B streptococcus: Secondary | ICD-10-CM

## 2013-09-13 DIAGNOSIS — D573 Sickle-cell trait: Secondary | ICD-10-CM | POA: Diagnosis present

## 2013-09-13 DIAGNOSIS — O99892 Other specified diseases and conditions complicating childbirth: Secondary | ICD-10-CM | POA: Diagnosis present

## 2013-09-13 DIAGNOSIS — O365990 Maternal care for other known or suspected poor fetal growth, unspecified trimester, not applicable or unspecified: Secondary | ICD-10-CM | POA: Diagnosis present

## 2013-09-13 DIAGNOSIS — E876 Hypokalemia: Secondary | ICD-10-CM | POA: Diagnosis present

## 2013-09-13 DIAGNOSIS — O9989 Other specified diseases and conditions complicating pregnancy, childbirth and the puerperium: Secondary | ICD-10-CM

## 2013-09-13 DIAGNOSIS — O139 Gestational [pregnancy-induced] hypertension without significant proteinuria, unspecified trimester: Principal | ICD-10-CM | POA: Diagnosis present

## 2013-09-13 LAB — COMPREHENSIVE METABOLIC PANEL
ALT: 8 U/L (ref 0–35)
AST: 13 U/L (ref 0–37)
Albumin: 2.5 g/dL — ABNORMAL LOW (ref 3.5–5.2)
Alkaline Phosphatase: 142 U/L — ABNORMAL HIGH (ref 39–117)
BUN: 3 mg/dL — AB (ref 6–23)
CALCIUM: 9.3 mg/dL (ref 8.4–10.5)
CO2: 21 mEq/L (ref 19–32)
CREATININE: 0.56 mg/dL (ref 0.50–1.10)
Chloride: 102 mEq/L (ref 96–112)
GFR calc Af Amer: >90 mL/min (ref >90)
GFR calc non Af Amer: >90 mL/min (ref >90)
GLUCOSE: 133 mg/dL — AB (ref 70–99)
Potassium: 3.1 mEq/L — ABNORMAL LOW (ref 3.7–5.3)
SODIUM: 138 meq/L (ref 137–147)
TOTAL PROTEIN: 6.2 g/dL (ref 6.0–8.3)
Total Bilirubin: 0.6 mg/dL (ref 0.3–1.2)

## 2013-09-13 LAB — CBC
HCT: 34.2 % — ABNORMAL LOW (ref 36.0–46.0)
HEMATOCRIT: 35.5 % — AB (ref 36.0–46.0)
HEMOGLOBIN: 11.9 g/dL — AB (ref 12.0–15.0)
HEMOGLOBIN: 12.4 g/dL (ref 12.0–15.0)
MCH: 28.3 pg (ref 26.0–34.0)
MCH: 28.4 pg (ref 26.0–34.0)
MCHC: 34.8 g/dL (ref 30.0–36.0)
MCHC: 34.9 g/dL (ref 30.0–36.0)
MCV: 81.4 fL (ref 78.0–100.0)
MCV: 81.2 fL (ref 78.0–100.0)
Platelets: 201 10*3/uL (ref 150–400)
Platelets: 195 10*3/uL (ref 150–400)
RBC: 4.2 MIL/uL (ref 3.87–5.11)
RBC: 4.37 MIL/uL (ref 3.87–5.11)
RDW: 16 % — ABNORMAL HIGH (ref 11.5–15.5)
RDW: 16 % — ABNORMAL HIGH (ref 11.5–15.5)
WBC: 10.2 10*3/uL (ref 4.0–10.5)
WBC: 15.4 10*3/uL — AB (ref 4.0–10.5)

## 2013-09-13 LAB — URIC ACID: Uric Acid, Serum: 4.6 mg/dL (ref 2.4–7.0)

## 2013-09-13 LAB — RPR: RPR Ser Ql: NONREACTIVE

## 2013-09-13 LAB — OB RESULTS CONSOLE GBS: GBS: POSITIVE

## 2013-09-13 LAB — LACTATE DEHYDROGENASE: LDH: 201 U/L (ref 94–250)

## 2013-09-13 MED ORDER — OXYTOCIN BOLUS FROM INFUSION: 500.0000 mL | INTRAVENOUS | Status: DC

## 2013-09-13 MED ORDER — EPHEDRINE 5 MG/ML INJ
10.0000 mg | INTRAVENOUS | Status: DC | PRN
  Filled 2013-09-13: qty 4
  Filled 2013-09-13: qty 2

## 2013-09-13 MED ORDER — PENICILLIN G POTASSIUM 5000000 UNITS IJ SOLR
5.0000 10*6.[IU] | Freq: Once | INTRAVENOUS | Status: AC
  Administered 2013-09-13: 5 10*6.[IU] via INTRAVENOUS
  Filled 2013-09-13: qty 5

## 2013-09-13 MED ORDER — MISOPROSTOL 25 MCG QUARTER TABLET
25.0000 ug | ORAL_TABLET | ORAL | Status: DC | PRN
  Filled 2013-09-13: qty 1

## 2013-09-13 MED ORDER — ZOLPIDEM TARTRATE 5 MG PO TABS: 5.0000 mg | ORAL_TABLET | Freq: Every evening | ORAL | Status: DC | PRN

## 2013-09-13 MED ORDER — ACETAMINOPHEN 325 MG PO TABS: 650.0000 mg | ORAL_TABLET | ORAL | Status: DC | PRN

## 2013-09-13 MED ORDER — PHENYLEPHRINE 40 MCG/ML (10ML) SYRINGE FOR IV PUSH (FOR BLOOD PRESSURE SUPPORT)
80.0000 ug | PREFILLED_SYRINGE | INTRAVENOUS | Status: DC | PRN
  Filled 2013-09-13: qty 2

## 2013-09-13 MED ORDER — LACTATED RINGERS IV SOLN
500.0000 mL | INTRAVENOUS | Status: DC | PRN
  Administered 2013-09-14: 500 mL via INTRAVENOUS

## 2013-09-13 MED ORDER — BUTORPHANOL TARTRATE 1 MG/ML IJ SOLN
1.0000 mg | INTRAMUSCULAR | Status: DC | PRN
  Administered 2013-09-13: 1 mg via INTRAVENOUS
  Filled 2013-09-13: qty 1

## 2013-09-13 MED ORDER — DIPHENHYDRAMINE HCL 50 MG/ML IJ SOLN: 12.5000 mg | INTRAMUSCULAR | Status: DC | PRN

## 2013-09-13 MED ORDER — IBUPROFEN 600 MG PO TABS
600.0000 mg | ORAL_TABLET | Freq: Four times a day (QID) | ORAL | Status: DC | PRN
  Administered 2013-09-14: 600 mg via ORAL
  Filled 2013-09-13: qty 1

## 2013-09-13 MED ORDER — EPHEDRINE 5 MG/ML INJ
10.0000 mg | INTRAVENOUS | Status: DC | PRN
  Filled 2013-09-13: qty 2

## 2013-09-13 MED ORDER — LACTATED RINGERS IV SOLN
INTRAVENOUS | Status: DC
  Administered 2013-09-13 – 2013-09-14 (×4): via INTRAVENOUS

## 2013-09-13 MED ORDER — POTASSIUM CHLORIDE CRYS ER 20 MEQ PO TBCR
40.0000 meq | EXTENDED_RELEASE_TABLET | Freq: Four times a day (QID) | ORAL | Status: DC
  Administered 2013-09-13 – 2013-09-14 (×2): 40 meq via ORAL
  Filled 2013-09-13 (×3): qty 2

## 2013-09-13 MED ORDER — PHENYLEPHRINE 40 MCG/ML (10ML) SYRINGE FOR IV PUSH (FOR BLOOD PRESSURE SUPPORT)
80.0000 ug | PREFILLED_SYRINGE | INTRAVENOUS | Status: DC | PRN
  Filled 2013-09-13: qty 2
  Filled 2013-09-13: qty 10

## 2013-09-13 MED ORDER — POTASSIUM CHLORIDE CRYS ER 20 MEQ PO TBCR
40.0000 meq | EXTENDED_RELEASE_TABLET | Freq: Once | ORAL | Status: AC
  Administered 2013-09-13: 40 meq via ORAL
  Filled 2013-09-13: qty 2

## 2013-09-13 MED ORDER — OXYCODONE-ACETAMINOPHEN 5-325 MG PO TABS: 1.0000 | ORAL_TABLET | ORAL | Status: DC | PRN

## 2013-09-13 MED ORDER — LIDOCAINE HCL (PF) 1 % IJ SOLN
30.0000 mL | INTRAMUSCULAR | Status: DC | PRN
  Filled 2013-09-13: qty 30

## 2013-09-13 MED ORDER — PENICILLIN G POTASSIUM 5000000 UNITS IJ SOLR
2.5000 10*6.[IU] | INTRAVENOUS | Status: DC
  Administered 2013-09-13 – 2013-09-14 (×5): 2.5 10*6.[IU] via INTRAVENOUS
  Filled 2013-09-13 (×8): qty 2.5

## 2013-09-13 MED ORDER — TERBUTALINE SULFATE 1 MG/ML IJ SOLN
0.2500 mg | Freq: Once | INTRAMUSCULAR | Status: AC | PRN
Start: 2013-09-13 — End: 2013-09-13

## 2013-09-13 MED ORDER — FENTANYL 2.5 MCG/ML BUPIVACAINE 1/10 % EPIDURAL INFUSION (WH - ANES)
14.0000 mL/h | INTRAMUSCULAR | Status: DC | PRN
  Administered 2013-09-13: 12 mL/h via EPIDURAL
  Filled 2013-09-13: qty 125

## 2013-09-13 MED ORDER — OXYTOCIN 40 UNITS IN LACTATED RINGERS INFUSION - SIMPLE MED: 62.5000 mL/h | INTRAVENOUS | Status: DC

## 2013-09-13 MED ORDER — CITRIC ACID-SODIUM CITRATE 334-500 MG/5ML PO SOLN: 30.0000 mL | ORAL | Status: DC | PRN

## 2013-09-13 MED ORDER — ONDANSETRON HCL 4 MG/2ML IJ SOLN: 4.0000 mg | Freq: Four times a day (QID) | INTRAMUSCULAR | Status: DC | PRN

## 2013-09-13 MED ORDER — OXYTOCIN 40 UNITS IN LACTATED RINGERS INFUSION - SIMPLE MED
1.0000 m[IU]/min | INTRAVENOUS | Status: DC
  Administered 2013-09-13: 2 m[IU]/min via INTRAVENOUS
  Filled 2013-09-13 (×2): qty 1000

## 2013-09-13 MED ORDER — LACTATED RINGERS IV SOLN
500.0000 mL | Freq: Once | INTRAVENOUS | Status: AC
  Administered 2013-09-13: 500 mL via INTRAVENOUS

## 2013-09-13 MED ORDER — LIDOCAINE HCL (PF) 1 % IJ SOLN
INTRAMUSCULAR | Status: DC | PRN
  Administered 2013-09-13: 2 mL
  Administered 2013-09-13 (×3): 4 mL

## 2013-09-13 NOTE — Anesthesia Preprocedure Evaluation (Signed)
Anesthesia Evaluation  Patient identified by MRN, date of birth, ID band Patient awake    Reviewed: Allergy & Precautions, H&P , NPO status , Patient's Chart, lab work & pertinent test results, reviewed documented beta blocker date and time   History of Anesthesia Complications Negative for: history of anesthetic complications  Airway Mallampati: III TM Distance: >3 FB Neck ROM: full    Dental  (+) Teeth Intact   Pulmonary neg pulmonary ROS,  breath sounds clear to auscultation        Cardiovascular hypertension (GHTN), negative cardio ROS  Rhythm:regular Rate:Normal     Neuro/Psych negative neurological ROS  negative psych ROS   GI/Hepatic negative GI ROS, Neg liver ROS,   Endo/Other  Obese BMI 34.4  Renal/GU negative Renal ROSHypokalemia (K 3.1)     Musculoskeletal   Abdominal   Peds  Hematology negative hematology ROS (+) Sickle cell trait ,   Anesthesia Other Findings   Reproductive/Obstetrics (+) Pregnancy                           Anesthesia Physical Anesthesia Plan  ASA: III  Anesthesia Plan: Epidural   Post-op Pain Management:    Induction:   Airway Management Planned:   Additional Equipment:   Intra-op Plan:   Post-operative Plan:   Informed Consent: I have reviewed the patients History and Physical, chart, labs and discussed the procedure including the risks, benefits and alternatives for the proposed anesthesia with the patient or authorized representative who has indicated his/her understanding and acceptance.     Plan Discussed with:   Anesthesia Plan Comments:         Anesthesia Quick Evaluation

## 2013-09-13 NOTE — H&P (Signed)
Etta QuillLakea Hackett is a 28 y.o. female  G2 P0010 at 3237 0/7 weeks c/w 8 week ultrasound presenting for induction of labor due to Gestational Hypertension.  Pt has been on Pitocin since arrival at 0700.  Reports cramping, 3/10 pain. Gest HTN diagnosed at 35 weeks.  BPs ~ 140s/90-100s. Protein/creatnine ratio 0.13, 0.24 , 24 hour urine protein 72.  Pt did not require medication as BP improved with bedrest.  Had intermittent headaches relieved with Tylenol.  Ultrasound done on 12/10 showed SGA fetus, 18%, 5 lbs 3 oz.   Pt has also had non-reactive NST with normal BPP during antenatal survillence.  Per MFM recommendations, IOL recommended at 37 weeks due to Gest HTN.   Normal fluid.  Symmetrically small.  Both mother and father were 5 lb babies.   Pt has also had hypokalemia with low normal magnesium.  Treated effectively with K-Dur. Sickle cell trait positive.  FOB was negative for [place] trait.  EIF seen at time of anatomy scan.  Quad screen negative.  GBS+ by urine.  Maternal Medical History:  Contractions: Frequency: regular.   Perceived severity is mild.   Onset with Pitocin.  Fetal activity: Perceived fetal activity is normal.    Prenatal complications: PIH.   Prenatal Complications - Diabetes: none.    OB History   Grav Para Term Preterm Abortions TAB SAB Ect Mult Living   2    1  1         Past Medical History  Diagnosis Date  . Pregnancy induced hypertension   . Anemia   . Hypokalemia   . Sickle cell trait   . Vaginal Pap smear, abnormal    Past Surgical History  Procedure Laterality Date  . No past surgeries     Family History: family history includes COPD in her paternal grandfather and paternal grandmother; Diabetes in her maternal grandfather, paternal grandfather, and paternal grandmother; Hypertension in her paternal grandfather and paternal grandmother. Social History:  reports that she has never smoked. She has never used smokeless tobacco. She reports that she does not drink  alcohol or use illicit drugs.   Prenatal Transfer Tool  Maternal Diabetes: No Genetic Screening: Normal Maternal Ultrasounds/Referrals: Abnormal:  Findings:   Isolated EIF (echogenic intracardiac focus) Fetal Ultrasounds or other Referrals:  None Maternal Substance Abuse:  No Significant Maternal Medications:  None Significant Maternal Lab Results:  Lab values include: Group B Strep positive, [place] trait Other Comments:  None  ROS  Dilation: 2 Effacement (%): 70 Station: -2 Exam by:: dr Dion Bodyvarnado Blood pressure 138/83, pulse 75, temperature 98.3 F (36.8 C), temperature source Oral, resp. rate 20, height 5' (1.524 m), weight 79.833 kg (176 lb), last menstrual period 12/28/2012. Maternal Exam:  Uterine Assessment: Contraction strength is mild.  Contraction frequency is regular.   Abdomen: Estimated fetal weight is 2347 grams, 5 lbs. 3 oz. +/- 6 oz. (18%) .   Fetal presentation: vertex  Introitus: Normal vulva. Normal vagina.  Ferning test: not done.   Pelvis: adequate for delivery.   Cervix: Cervix evaluated by sterile speculum exam and digital exam.     Fetal Exam Fetal Monitor Review: Mode: fetoscope.   Variability: moderate (6-25 bpm).   Pattern: accelerations present and variable decelerations.    Fetal State Assessment: Category I - tracings are normal.     Physical Exam  Constitutional: She is oriented to person, place, and time. She appears well-developed and well-nourished. No distress.  HENT:  Head: Normocephalic and atraumatic.  Eyes: EOM  are normal.  Neck: Normal range of motion.  Cardiovascular: Normal rate and regular rhythm.   Murmur heard. Respiratory: Effort normal and breath sounds normal. No respiratory distress. She has no wheezes.  GI: There is no tenderness.  Genitourinary: Vagina normal.  Musculoskeletal: Normal range of motion. She exhibits edema. She exhibits no tenderness.  Neurological: She is alert and oriented to person, place, and time.  She has normal reflexes.  2-3+ DTR  Skin: Skin is warm and dry. She is not diaphoretic.  Psychiatric: She has a normal mood and affect.    Prenatal labs: ABO, Rh: O/Positive/-- (08/20 0000) Antibody: Negative (08/20 0000) Rubella: Immune (08/20 0000) RPR: NON REACTIVE (02/16 0800)  HBsAg: Negative (08/20 0000)  HIV: Non-reactive (08/20 0000)  GBS: Positive (02/16 0000)   Assessment/Plan: IUP at 37 0/[redacted] weeks Gestational HTN. Negative Preeclampsia labs.  Normal to mildly elevated BP. GBS bacturia [place] Trait positive  IOL with Pitocin.  Place Foley bulb. Reassuring fetal status. PCN for GBS prophylaxis. Monitor closely for s/sxs of preeclampsia.    [name] 09/13/2013, 3:22 PM

## 2013-09-13 NOTE — Progress Notes (Signed)
Dr Richardson Doppcole called for update on pt status, informed of foley bulb status, pitcocin mmu. To leave pitocin at 6mu overnight, and to call dr Richardson Doppcole if pt srom, or 7cm so she can come in house.

## 2013-09-13 NOTE — Progress Notes (Addendum)
Attempted to place foley bulb with sterile technique.  Graves speculum placed.  Betadine used to clean cervix.  Pt unable to tolerate speculum.  Slid out with Valsalva. Foley placed digitally.  Pt with mod-severe discomfort.  Bulb well applied to cervix. Pitocin with contractions q 1 min.  Pitocin decreased from 10 mUs to 6 mu.

## 2013-09-13 NOTE — Progress Notes (Signed)
Linda Cole is a 28 y.o. G2P0010 at 8369w0d by  admitted for induction of labor due to gestational hypertension .  Subjective:  pt reports contraction 6 out of  10 no lof some blood show  Objective: BP 136/82  Pulse 84  Temp(Src) 97.5 F (36.4 C) (Oral)  Resp 18  Ht 5' (1.524 m)  Wt 79.833 kg (176 lb)  BMI 34.37 kg/m2  LMP 12/28/2012      FHT:  FHR: 135 bpm, variability: moderate,  accelerations:  Present,  decelerations:  Absent UC:   irregular, every 3 minutes SVE:   Dilation: 6 Effacement (%): 70 Station: -2 Exam by:: dr Richardson Doppcole  Labs: Lab Results  Component Value Date   WBC 10.2 09/13/2013   HGB 11.9* 09/13/2013   HCT 34.2* 09/13/2013   MCV 81.4 09/13/2013   PLT 201 09/13/2013    Assessment / Plan: Induction of labor due to gestational hypertension,  progressing well on pitocin  Labor: Progressing on Pitocin, will continue to increase then AROM Preeclampsia:  NA Fetal Wellbeing:  Category I Pain Control:  pt desires epidural  I/D:  penicillin  Anticipated MOD:  svd  Linda Cole J. 09/13/2013, 10:14 PM

## 2013-09-13 NOTE — Anesthesia Procedure Notes (Signed)
Epidural Patient location during procedure: OB Start time: 09/13/2013 11:27 PM  Staffing Performed by: anesthesiologist   Preanesthetic Checklist Completed: patient identified, site marked, surgical consent, pre-op evaluation, timeout performed, IV checked, risks and benefits discussed and monitors and equipment checked  Epidural Patient position: sitting Prep: site prepped and draped and DuraPrep Patient monitoring: continuous pulse ox and blood pressure Approach: midline Injection technique: LOR air  Needle:  Needle type: Tuohy  Needle gauge: 17 G Needle length: 9 cm and 9 Needle insertion depth: 6 cm Catheter type: closed end flexible Catheter size: 19 Gauge Catheter at skin depth: 11 cm Test dose: negative  Assessment Events: blood not aspirated, injection not painful, no injection resistance, negative IV test and no paresthesia  Additional Notes Discussed risk of headache, infection, bleeding, nerve injury and failed or incomplete block.  Patient voices understanding and wishes to proceed.  Epidural placed easily on first attempt.  No paresthesia.  Patient tolerated procedure well with no apparent complications.  Jasmine DecemberA. Iktan Aikman, MDReason for block:procedure for pain

## 2013-09-13 NOTE — Progress Notes (Signed)
Pt more comfortable than during foley bulb placement but contractions have intensified since foley bulb placement.  Denies LOF.  Stadol once. SVE bulb almost expelled.  3 cm, 100% effaced.  No blood on glove. Pitocin at 6 mUs. EFM  Cat1.  Contractions q 2 mins. Continue Pitocin at current dosage. Stadol prn.  Epidural per pt request. Will check out to Dr. Richardson Doppole who is on call after 5 pm.

## 2013-09-14 ENCOUNTER — Encounter (HOSPITAL_COMMUNITY): Payer: Self-pay

## 2013-09-14 LAB — CBC
HCT: 36.3 % (ref 36.0–46.0)
HEMOGLOBIN: 12.3 g/dL (ref 12.0–15.0)
MCH: 27.6 pg (ref 26.0–34.0)
MCHC: 33.9 g/dL (ref 30.0–36.0)
MCV: 81.6 fL (ref 78.0–100.0)
PLATELETS: 193 10*3/uL (ref 150–400)
RBC: 4.45 MIL/uL (ref 3.87–5.11)
RDW: 16.1 % — ABNORMAL HIGH (ref 11.5–15.5)
WBC: 15.7 10*3/uL — AB (ref 4.0–10.5)

## 2013-09-14 LAB — ABO/RH: ABO/RH(D): O POS

## 2013-09-14 MED ORDER — FERROUS SULFATE 325 (65 FE) MG PO TABS
325.0000 mg | ORAL_TABLET | Freq: Two times a day (BID) | ORAL | Status: DC
  Administered 2013-09-14 – 2013-09-16 (×4): 325 mg via ORAL
  Filled 2013-09-14 (×4): qty 1

## 2013-09-14 MED ORDER — PRENATAL MULTIVITAMIN CH
1.0000 | ORAL_TABLET | Freq: Every day | ORAL | Status: DC
  Administered 2013-09-14 – 2013-09-15 (×2): 1 via ORAL
  Filled 2013-09-14 (×2): qty 1

## 2013-09-14 MED ORDER — ONDANSETRON HCL 4 MG/2ML IJ SOLN: 4.0000 mg | INTRAMUSCULAR | Status: DC | PRN

## 2013-09-14 MED ORDER — DIPHENHYDRAMINE HCL 25 MG PO CAPS: 25.0000 mg | ORAL_CAPSULE | Freq: Four times a day (QID) | ORAL | Status: DC | PRN

## 2013-09-14 MED ORDER — ONDANSETRON HCL 4 MG PO TABS
4.0000 mg | ORAL_TABLET | ORAL | Status: DC | PRN
Start: 2013-09-14 — End: 2013-09-16

## 2013-09-14 MED ORDER — WITCH HAZEL-GLYCERIN EX PADS: 1.0000 "application " | MEDICATED_PAD | CUTANEOUS | Status: DC | PRN

## 2013-09-14 MED ORDER — IBUPROFEN 600 MG PO TABS
600.0000 mg | ORAL_TABLET | Freq: Four times a day (QID) | ORAL | Status: DC
  Administered 2013-09-14 – 2013-09-16 (×7): 600 mg via ORAL
  Filled 2013-09-14 (×7): qty 1

## 2013-09-14 MED ORDER — LANOLIN HYDROUS EX OINT: TOPICAL_OINTMENT | CUTANEOUS | Status: DC | PRN

## 2013-09-14 MED ORDER — SENNOSIDES-DOCUSATE SODIUM 8.6-50 MG PO TABS
2.0000 | ORAL_TABLET | ORAL | Status: DC
  Administered 2013-09-14: 2 via ORAL
  Filled 2013-09-14 (×2): qty 2

## 2013-09-14 MED ORDER — DIBUCAINE 1 % RE OINT: 1.0000 "application " | TOPICAL_OINTMENT | RECTAL | Status: DC | PRN

## 2013-09-14 MED ORDER — LACTATED RINGERS IV SOLN
INTRAVENOUS | Status: DC
  Administered 2013-09-14: 06:00:00 via INTRAUTERINE

## 2013-09-14 MED ORDER — ZOLPIDEM TARTRATE 5 MG PO TABS: 5.0000 mg | ORAL_TABLET | Freq: Every evening | ORAL | Status: DC | PRN

## 2013-09-14 MED ORDER — BENZOCAINE-MENTHOL 20-0.5 % EX AERO
1.0000 "application " | INHALATION_SPRAY | CUTANEOUS | Status: DC | PRN
  Administered 2013-09-16: 1 via TOPICAL
  Filled 2013-09-14: qty 56

## 2013-09-14 MED ORDER — OXYCODONE-ACETAMINOPHEN 5-325 MG PO TABS: 1.0000 | ORAL_TABLET | ORAL | Status: DC | PRN

## 2013-09-14 MED ORDER — SIMETHICONE 80 MG PO CHEW: 80.0000 mg | CHEWABLE_TABLET | ORAL | Status: DC | PRN

## 2013-09-14 NOTE — Anesthesia Postprocedure Evaluation (Signed)
  Anesthesia Post-op Note  Patient: Linda Cole  Procedure(s) Performed: * No procedures listed *  Patient Location: Mother/Baby  Anesthesia Type:Epidural  Level of Consciousness: awake  Airway and Oxygen Therapy: Patient Spontanous Breathing  Post-op Pain: mild  Post-op Assessment: Post-op Vital signs reviewed  Post-op Vital Signs: stable  Complications: No apparent anesthesia complications

## 2013-09-14 NOTE — Progress Notes (Signed)
Linda Cole is a 28 y.o. G2P0010 at 1266w1d by  admitted for induction of labor due to gestational hypertension .  Subjective:   Objective: BP 109/91  Pulse 92  Temp(Src) 98.4 F (36.9 C) (Oral)  Resp 18  Ht 5' (1.524 m)  Wt 79.833 kg (176 lb)  BMI 34.37 kg/m2  SpO2 99%  LMP 12/28/2012      FHT:  FHR: 135 bpm, variability: moderate,  accelerations:  Present,  decelerations:  Present variable UC:   regular, every 2 minutes SVE:   7/80/0 Arom clear fluid IUPC and FSE placed  Labs: Lab Results  Component Value Date   WBC 15.4* 09/13/2013   HGB 12.4 09/13/2013   HCT 35.5* 09/13/2013   MCV 81.2 09/13/2013   PLT 195 09/13/2013    Assessment / Plan: 37 wks 1 day on pitocin   Labor: progressing slowly  Preeclampsia:  NA Fetal Wellbeing:  Category II start amnioinfusion  Pain Control:  Epidural I/D:  PCN Anticipated MOD:  NSVD  Nyoka Alcoser J. 09/14/2013, 6:38 AM

## 2013-09-15 LAB — CBC
HCT: 32.6 % — ABNORMAL LOW (ref 36.0–46.0)
Hemoglobin: 11.2 g/dL — ABNORMAL LOW (ref 12.0–15.0)
MCH: 28.1 pg (ref 26.0–34.0)
MCHC: 34.4 g/dL (ref 30.0–36.0)
MCV: 81.7 fL (ref 78.0–100.0)
Platelets: 204 10*3/uL (ref 150–400)
RBC: 3.99 MIL/uL (ref 3.87–5.11)
RDW: 16.7 % — ABNORMAL HIGH (ref 11.5–15.5)
WBC: 13.1 10*3/uL — ABNORMAL HIGH (ref 4.0–10.5)

## 2013-09-15 NOTE — Progress Notes (Signed)
Postpartum day #1, NSVD  Subjective Pt without complaints.  Lochia normal.  Pain controlled.  Breast feeding yes.  Baby latching well. Pt denies headaches and visual changes.  Temp:  [97.9 F (36.6 C)-98.7 F (37.1 C)] 97.9 F (36.6 C) (02/18 0543) Pulse Rate:  [73-80] 73 (02/18 0543) Resp:  [18-19] 19 (02/18 0543) BP: (132-135)/(85-90) 132/90 mmHg (02/18 0543) Highest 142/99  Gen:  NAD, A&O x 3 Uterine fundus:  Firm, nontender Lochia normal Ext:  Edema present, no calf tenderness bilaterally  CBC    Component Value Date/Time   WBC 13.1* 09/15/2013 0555   RBC 3.99 09/15/2013 0555   HGB 11.2* 09/15/2013 0555   HCT 32.6* 09/15/2013 0555   PLT 204 09/15/2013 0555   MCV 81.7 09/15/2013 0555   MCH 28.1 09/15/2013 0555   MCHC 34.4 09/15/2013 0555   RDW 16.7* 09/15/2013 0555     A/P: S/p SVD doing well. Gestational hypertension.  BPs normal to mildly elevated.  Will not start medication at this time. Routine postpartum care. Lactation support. Discharge in am.  Linda Cole, Linda Cole 09/15/2013, 3:56 PM

## 2013-09-16 MED ORDER — IBUPROFEN 600 MG PO TABS: 600.0000 mg | ORAL_TABLET | Freq: Four times a day (QID) | ORAL | Status: DC

## 2013-09-16 NOTE — Progress Notes (Signed)
Postpartum day #2, NSVD  Subjective Pt without complaints.  No F/C/CP/SOB.  No headache or blurry vision.  Lochia appropriate.  Pain controlled.  Breast feeding yes.  Baby latching well.  Voiding and ambulating without difficulty.  +BM.   Temp:  [97.9 F (36.6 C)-99.2 F (37.3 C)] 97.9 F (36.6 C) (02/19 0100) Pulse Rate:  [69-76] 76 (02/19 0100) Resp:  [18] 18 (02/19 0100) BP: (141-145)/(86-99) 145/86 mmHg (02/19 0100)  Gen:  NAD, A&O x 3 CV: RRR Lungs: CTAB Uterine fundus:  Firm, nontender, below umbilicus Lochia normal Ext:  Minimal edema present, no calf tenderness bilaterally  CBC    Component Value Date/Time   WBC 13.1* 09/15/2013 0555   RBC 3.99 09/15/2013 0555   HGB 11.2* 09/15/2013 0555   HCT 32.6* 09/15/2013 0555   PLT 204 09/15/2013 0555   MCV 81.7 09/15/2013 0555   MCH 28.1 09/15/2013 0555   MCHC 34.4 09/15/2013 0555   RDW 16.7* 09/15/2013 0555     A/P: 27yo G2P1011 s/p NSVD PPD#2 -Doing well, meeting postpartum milestones appropriately Gestational hypertension.  BPs normal to mildly elevated.  Will not start medication at this time.  Plan for BP check in one week as outpatient. Routine postpartum care. Lactation support. Plan for discharge home today.  Linda Cole, Linda Cole, Linda Cole 09/16/2013, 5:56 AM

## 2013-09-16 NOTE — Discharge Instructions (Signed)

## 2013-09-16 NOTE — Lactation Note (Signed)
This note was copied from the chart of Linda Cole. Lactation Consultation Note  Patient Name: Linda Cole ZOXWR'UToday's Date: 09/16/2013 Reason for consult: Follow-up assessment;Infant < 6lbs Mom reports baby has been latching well. Baby sleepy at this visit, LC assisted Mom with the latch to obtain more depth with latch. Baby demonstrated a good suckling pattern, few audible swallows. Advised Mom to continue to que base BF, but if baby is sleepy to wake her to BF at least every 3 hours, keeping her actively nursing for 15-20 minutes or more. Engorgement care reviewed if needed. Mom has DEBP at home, encouraged to post pump during the day 4 times/day to encourage milk production since baby is small and just 37 weeks. Advised to give baby back any amount of EBM she receives. Advised of OP services and support group.   Maternal Data    Feeding Feeding Type: Breast Fed Length of feed: 15 min  LATCH Score/Interventions Latch: Repeated attempts needed to sustain latch, nipple held in mouth throughout feeding, stimulation needed to elicit sucking reflex. Intervention(s): Adjust position;Assist with latch;Breast massage;Breast compression  Audible Swallowing: A few with stimulation  Type of Nipple: Everted at rest and after stimulation  Comfort (Breast/Nipple): Soft / non-tender     Hold (Positioning): Assistance needed to correctly position infant at breast and maintain latch. Intervention(s): Breastfeeding basics reviewed;Support Pillows;Position options;Skin to skin  LATCH Score: 7  Lactation Tools Discussed/Used Tools: Pump;Flanges Flange Size: 27 Breast pump type: Manual   Consult Status Consult Status: Complete Date: 09/16/13 Follow-up type: In-patient    Alfred LevinsGranger, [name] 09/16/2013, 11:29 AM

## 2013-09-16 NOTE — Discharge Summary (Signed)
Obstetric Discharge Summary  Reason for Admission: induction of labor Date of Admission: 09/13/13 Date of Discharge: 09/16/12 Prenatal Procedures: NST and BPP Intrapartum Procedures: spontaneous vaginal delivery Postpartum Procedures: none Complications-Operative and Postpartum: none Hemoglobin  Date Value Ref Range Status  09/15/2013 11.2* 12.0 - 15.0 g/dL Final     HCT  Date Value Ref Range Status  09/15/2013 32.6* 36.0 - 46.0 % Final   Hospital Course: 27y G2P0010 @ 4714w1d admitted for IOL due to getational HTN.  Pregnancy complicated by the following: 1) gestational HTN, ruled out for preeclampsia, no BP medications indicated 2) Sickle cell trait 3) Antepartum monitoring: nonreactive NST, with normal BPP 4) Hypokalemia- treated with Kdur 5) GBS bacteruria The patient was induced with Pitocin and Foley balloon.  She received an epidural for pain and PCN for GBS prophylaxis.  Further measures including AROM and placement of internal monitors (IUPC & FSE) were performed.  The patient progressed to complete, NSVD performed by Dr. Gerald Leitzara Cole, no tears.  Delivery and postpartum course uncomplicated.  BP high normal, no medication or further intervention needed. Patient was discharged home in stable condition on PPD #2 with plans for outpatient BP check in one week.  Physical Exam:  General: alert, cooperative and no distress Lochia: appropriate Uterine Fundus: firm DVT Evaluation: No evidence of DVT seen on physical exam.  Discharge Diagnoses: Term Pregnancy-delivered  Discharge Information: Date: 09/16/2013 Activity: pelvic rest Diet: routine Medications: PNV, Ibuprofen and Iron Condition: stable Instructions: refer to practice specific booklet Discharge to: home Follow-up Information   Follow up with Geryl RankinsVARNADO, EVELYN, MD In 1 week. (For blood pressure check)    Specialty:  Obstetrics and Gynecology   Contact information:   [address] [place]  [id number] [phone]       Newborn Data: Live born female  Birth Weight: 5 lb 8 oz (2495 g) APGAR: 9, 9  Home with mother.  Myna HidalgoZAN, Keiran Sias, M 09/16/2013, 12:12 PM

## 2014-05-30 ENCOUNTER — Encounter (HOSPITAL_COMMUNITY): Payer: Self-pay

## 2014-12-21 ENCOUNTER — Other Ambulatory Visit (HOSPITAL_COMMUNITY)
Admission: RE | Admit: 2014-12-21 | Discharge: 2014-12-21 | Disposition: A | Discharge status: Home / Self Care | Payer: Commercial Managed Care - PPO | Source: Ambulatory Visit | Attending: Obstetrics and Gynecology | Admitting: Obstetrics and Gynecology

## 2014-12-21 ENCOUNTER — Other Ambulatory Visit: Payer: Self-pay | Admitting: Obstetrics and Gynecology

## 2014-12-21 DIAGNOSIS — Z01419 Encounter for gynecological examination (general) (routine) without abnormal findings: Secondary | ICD-10-CM | POA: Insufficient documentation

## 2014-12-23 LAB — CYTOLOGY - PAP

## 2017-01-30 ENCOUNTER — Ambulatory Visit (INDEPENDENT_AMBULATORY_CARE_PROVIDER_SITE_OTHER): Payer: 59 | Admitting: Family Medicine

## 2017-01-30 ENCOUNTER — Encounter: Payer: Self-pay | Admitting: Family Medicine

## 2017-01-30 DIAGNOSIS — E7800 Pure hypercholesterolemia, unspecified: Secondary | ICD-10-CM | POA: Diagnosis not present

## 2017-01-30 DIAGNOSIS — D573 Sickle-cell trait: Secondary | ICD-10-CM | POA: Diagnosis not present

## 2017-01-30 DIAGNOSIS — D500 Iron deficiency anemia secondary to blood loss (chronic): Secondary | ICD-10-CM

## 2017-01-30 DIAGNOSIS — Z8742 Personal history of other diseases of the female genital tract: Secondary | ICD-10-CM

## 2017-01-30 DIAGNOSIS — Z87898 Personal history of other specified conditions: Secondary | ICD-10-CM | POA: Diagnosis not present

## 2017-01-30 DIAGNOSIS — D649 Anemia, unspecified: Secondary | ICD-10-CM | POA: Insufficient documentation

## 2017-01-30 NOTE — Assessment & Plan Note (Signed)
Avoid saturated fats 

## 2017-01-30 NOTE — Progress Notes (Signed)
BP 118/76   Pulse 74   Temp 98.1 F (36.7 C) (Oral)   Resp 14   Ht 5' (1.524 m)   Wt 188 lb 6.4 oz (85.5 kg)   LMP 01/04/2017   SpO2 98%   Breastfeeding? Unknown   BMI 36.79 kg/m    Subjective:    Patient ID: Linda Cole, female    DOB: [birth year], 31 y.o.   MRN: [id number]  HPI: Linda Cole is a 31 y.o. female  Chief Complaint  Patient presents with  . Establish Care    HPI Patient is here to establish care Had [facility], had to be induced HTN runs in the family No problems with eczema or allergies or asthma; just as a child, but grew out She [facility] Saw her last year for annual, switched to Dr. Gunnar BullaVernado, with [facility]; last pap was back to normal No hx of breast problems Did have some US with new GYN for March and April for heavy menstrual bleeding; lining of uterus was thickened but not enough to biopsy; started lysteda PRN; problem better with medicine GYN checked CBC and was iron deficient; not taking but encouraged to do so Not really tired or run down Hx of low K+; no cramps in calves, no palpitations, just when pregnant Last cholesterol was normal at physical in August, LDL a little above normal; no medicine needed; runs in family most likely  [facility] 01/30/2017  Decreased Interest 0  Down, Depressed, Hopeless 0  PHQ - 2 Score 0    Relevant past medical, surgical, family and social history reviewed Past Medical History:  Diagnosis Date  . Anemia   . Hypokalemia   . Pregnancy induced hypertension   . Sickle cell trait (HCC)   . Vaginal Pap smear, abnormal    Past Surgical History:  Procedure Laterality Date  . NO PAST SURGERIES     Family History  Problem Relation Age of Onset  . Diabetes Maternal Grandfather   . Hypertension Paternal Grandmother   . Diabetes Paternal Grandmother   . COPD Paternal Grandmother   . Hypertension Paternal Grandfather   . Diabetes Paternal Grandfather   . COPD  Paternal Grandfather   . Hyperlipidemia Mother   . Kidney disease Father        on dyalsis  . Eczema Daughter   . COPD Maternal Grandmother   . Hypertension Maternal Grandmother    Social History   Social History  . Marital status: Single    Spouse name: N/A  . Number of children: N/A  . Years of education: N/A   Occupational History  . Not on file.   Social History Main Topics  . Smoking status: Never Smoker  . Smokeless tobacco: Never Used  . Alcohol use Yes     Comment: social  . Drug use: No  . Sexual activity: Yes   Other Topics Concern  . Not on file   Social History Narrative  . No narrative on file    Interim medical history since last visit reviewed. Allergies and medications reviewed  Review of Systems Per HPI unless specifically indicated above     Objective:    BP 118/76   Pulse 74   Temp 98.1 F (36.7 C) (Oral)   Resp 14   Ht 5' (1.524 m)   Wt 188 lb 6.4 oz (85.5 kg)   LMP 01/04/2017   SpO2 98%   Breastfeeding? Unknown  BMI 36.79 kg/m   Wt Readings from Last 3 Encounters:  01/30/17 188 lb 6.4 oz (85.5 kg)  09/13/13 176 lb (79.8 kg)  09/09/13 176 lb (79.8 kg)    Physical Exam  Constitutional: She appears well-developed and well-nourished. No distress.  Eyes: EOM are normal. No scleral icterus.  Neck: No thyromegaly present.  Cardiovascular: Normal rate, regular rhythm and normal heart sounds.   Pulmonary/Chest: Effort normal and breath sounds normal. She has no wheezes.  Abdominal: Soft. She exhibits no distension.  Musculoskeletal: Normal range of motion. She exhibits no edema.  Neurological: She is alert. She exhibits normal muscle tone.  Skin: Skin is warm and dry. No pallor.  Psychiatric: She has a normal mood and affect. Her behavior is normal. Judgment and thought content normal.    Results for orders placed or performed in visit on 12/21/14  Cytology - PAP  Result Value Ref Range   CYTOLOGY - PAP PAP RESULT         Assessment & Plan:   Problem List Items Addressed This Visit      Other   Sickle cell trait (HCC)    noted      Hx of abnormal cervical Pap smear    Followed by gynecologist at [facility]      Elevated LDL cholesterol level    Avoid saturated fats      Anemia    Suggested getting back on iron supplement or iron-rich foods; recheck CBC and ferritin and iron with other labs in August         Follow up plan: Return for complete physical.  An after-visit summary was printed and given to the patient at check-out.  Please see the patient instructions which may contain other information and recommendations beyond what is mentioned above in the assessment and plan.  Meds ordered this encounter  Medications  . tranexamic acid (LYSTEDA) 650 MG TABS tablet    Sig: Take 1,300 mg by mouth as needed.  . naproxen (NAPROSYN) 250 MG tablet    Sig: Take 250 mg by mouth as needed.    No orders of the defined types were placed in this encounter.

## 2017-01-30 NOTE — Patient Instructions (Addendum)
Your goal blood pressure is less than 140 mmHg on top. Try to follow the DASH guidelines (DASH stands for Dietary Approaches to Stop Hypertension) Try to limit the sodium in your diet.  Ideally, consume less than 1.5 grams (less than 1,500mg ) per day. Do not add salt when cooking or at the table.  Check the sodium amount on labels when shopping, and choose items lower in sodium when given a choice. Avoid or limit foods that already contain a lot of sodium. Eat a diet rich in fruits and vegetables and whole grains. Return in late August or late September or October for next physical, come fasting for labs at that time   DASH Eating Plan DASH stands for "Dietary Approaches to Stop Hypertension." The DASH eating plan is a healthy eating plan that has been shown to reduce high blood pressure (hypertension). It may also reduce your risk for type 2 diabetes, heart disease, and stroke. The DASH eating plan may also help with weight loss. What are tips for following this plan? General guidelines  Avoid eating more than 2,300 mg (milligrams) of salt (sodium) a day. If you have hypertension, you may need to reduce your sodium intake to 1,500 mg a day.  Limit alcohol intake to no more than 1 drink a day for nonpregnant women and 2 drinks a day for men. One drink equals 12 oz of beer, 5 oz of wine, or 1 oz of hard liquor.  Work with your health care provider to maintain a healthy body weight or to lose weight. Ask what an ideal weight is for you.  Get at least 30 minutes of exercise that causes your heart to beat faster (aerobic exercise) most days of the week. Activities may include walking, swimming, or biking.  Work with your health care provider or diet and nutrition specialist (dietitian) to adjust your eating plan to your individual calorie needs. Reading food labels  Check food labels for the amount of sodium per serving. Choose foods with less than 5 percent of the Daily Value of sodium.  Generally, foods with less than 300 mg of sodium per serving fit into this eating plan.  To find whole grains, look for the word "whole" as the first word in the ingredient list. Shopping  Buy products labeled as "low-sodium" or "no salt added."  Buy fresh foods. Avoid canned foods and premade or frozen meals. Cooking  Avoid adding salt when cooking. Use salt-free seasonings or herbs instead of table salt or sea salt. Check with your health care provider or pharmacist before using salt substitutes.  Do not fry foods. Cook foods using healthy methods such as baking, boiling, grilling, and broiling instead.  Cook with heart-healthy oils, such as olive, canola, soybean, or sunflower oil. Meal planning   Eat a balanced diet that includes: ? 5 or more servings of fruits and vegetables each day. At each meal, try to fill half of your plate with fruits and vegetables. ? Up to 6-8 servings of whole grains each day. ? Less than 6 oz of lean meat, poultry, or fish each day. A 3-oz serving of meat is about the same size as a deck of cards. One egg equals 1 oz. ? 2 servings of low-fat dairy each day. ? A serving of nuts, seeds, or beans 5 times each week. ? Heart-healthy fats. Healthy fats called Omega-3 fatty acids are found in foods such as flaxseeds and coldwater fish, like sardines, salmon, and mackerel.  Limit how much you  eat of the following: ? Canned or prepackaged foods. ? Food that is high in trans fat, such as fried foods. ? Food that is high in saturated fat, such as fatty meat. ? Sweets, desserts, sugary drinks, and other foods with added sugar. ? Full-fat dairy products.  Do not salt foods before eating.  Try to eat at least 2 vegetarian meals each week.  Eat more home-cooked food and less restaurant, buffet, and fast food.  When eating at a restaurant, ask that your food be prepared with less salt or no salt, if possible. What foods are recommended? The items listed may  not be a complete list. Talk with your dietitian about what dietary choices are best for you. Grains Whole-grain or whole-wheat bread. Whole-grain or whole-wheat pasta. Brown rice. Modena Morrow. Bulgur. Whole-grain and low-sodium cereals. Pita bread. Low-fat, low-sodium crackers. Whole-wheat flour tortillas. Vegetables Fresh or frozen vegetables (raw, steamed, roasted, or grilled). Low-sodium or reduced-sodium tomato and vegetable juice. Low-sodium or reduced-sodium tomato sauce and tomato paste. Low-sodium or reduced-sodium canned vegetables. Fruits All fresh, dried, or frozen fruit. Canned fruit in natural juice (without added sugar). Meat and other protein foods Skinless chicken or [place]. Ground chicken or [place]. Pork with fat trimmed off. Fish and seafood. Egg whites. Dried beans, peas, or lentils. Unsalted nuts, nut butters, and seeds. Unsalted canned beans. Lean cuts of beef with fat trimmed off. Low-sodium, lean deli meat. Dairy Low-fat (1%) or fat-free (skim) milk. Fat-free, low-fat, or reduced-fat cheeses. Nonfat, low-sodium ricotta or cottage cheese. Low-fat or nonfat yogurt. Low-fat, low-sodium cheese. Fats and oils Soft margarine without trans fats. Vegetable oil. Low-fat, reduced-fat, or light mayonnaise and salad dressings (reduced-sodium). Canola, safflower, olive, soybean, and sunflower oils. Avocado. Seasoning and other foods Herbs. Spices. Seasoning mixes without salt. Unsalted popcorn and pretzels. Fat-free sweets. What foods are not recommended? The items listed may not be a complete list. Talk with your dietitian about what dietary choices are best for you. Grains Baked goods made with fat, such as croissants, muffins, or some breads. Dry pasta or rice meal packs. Vegetables Creamed or fried vegetables. Vegetables in a cheese sauce. Regular canned vegetables (not low-sodium or reduced-sodium). Regular canned tomato sauce and paste (not low-sodium or reduced-sodium).  Regular tomato and vegetable juice (not low-sodium or reduced-sodium). Angie Fava. Olives. Fruits Canned fruit in a light or heavy syrup. Fried fruit. Fruit in cream or butter sauce. Meat and other protein foods Fatty cuts of meat. Ribs. Fried meat. Berniece Salines. Sausage. Bologna and other processed lunch meats. Salami. Fatback. Hotdogs. Bratwurst. Salted nuts and seeds. Canned beans with added salt. Canned or smoked fish. Whole eggs or egg yolks. Chicken or [place] with skin. Dairy Whole or 2% milk, cream, and half-and-half. Whole or full-fat cream cheese. Whole-fat or sweetened yogurt. Full-fat cheese. Nondairy creamers. Whipped toppings. Processed cheese and cheese spreads. Fats and oils Butter. Stick margarine. Lard. Shortening. Ghee. Bacon fat. Tropical oils, such as coconut, palm kernel, or palm oil. Seasoning and other foods Salted popcorn and pretzels. Onion salt, garlic salt, seasoned salt, table salt, and sea salt. Worcestershire sauce. Tartar sauce. Barbecue sauce. Teriyaki sauce. Soy sauce, including reduced-sodium. Steak sauce. Canned and packaged gravies. Fish sauce. Oyster sauce. Cocktail sauce. Horseradish that you find on the shelf. Ketchup. Mustard. Meat flavorings and tenderizers. Bouillon cubes. Hot sauce and Tabasco sauce. Premade or packaged marinades. Premade or packaged taco seasonings. Relishes. Regular salad dressings. Where to find more information:  National Heart, Lung, and Blood Institute: https://wilson-eaton.com/  American  Heart Association: www.heart.org Summary  The DASH eating plan is a healthy eating plan that has been shown to reduce high blood pressure (hypertension). It may also reduce your risk for type 2 diabetes, heart disease, and stroke.  With the DASH eating plan, you should limit salt (sodium) intake to 2,300 mg a day. If you have hypertension, you may need to reduce your sodium intake to 1,500 mg a day.  When on the DASH eating plan, aim to eat more fresh fruits and  vegetables, whole grains, lean proteins, low-fat dairy, and heart-healthy fats.  Work with your health care provider or diet and nutrition specialist (dietitian) to adjust your eating plan to your individual calorie needs. This information is not intended to replace advice given to you by your health care provider. Make sure you discuss any questions you have with your health care provider. Document Released: 07/04/2011 Document Revised: 07/08/2016 Document Reviewed: 07/08/2016 Elsevier Interactive Patient Education  2017 [employer].

## 2017-01-30 NOTE — Assessment & Plan Note (Signed)
Suggested getting back on iron supplement or iron-rich foods; recheck CBC and ferritin and iron with other labs in August

## 2017-02-04 DIAGNOSIS — D573 Sickle-cell trait: Secondary | ICD-10-CM | POA: Insufficient documentation

## 2017-02-04 DIAGNOSIS — Z8742 Personal history of other diseases of the female genital tract: Secondary | ICD-10-CM | POA: Insufficient documentation

## 2017-02-04 NOTE — Assessment & Plan Note (Signed)
Follow[facility]

## 2017-02-04 NOTE — Assessment & Plan Note (Signed)
noted 

## 2017-04-30 ENCOUNTER — Other Ambulatory Visit: Payer: Self-pay | Admitting: Obstetrics and Gynecology

## 2017-04-30 ENCOUNTER — Other Ambulatory Visit (HOSPITAL_COMMUNITY)
Admission: RE | Admit: 2017-04-30 | Discharge: 2017-04-30 | Disposition: A | Discharge status: Home / Self Care | Payer: 59 | Source: Ambulatory Visit | Attending: Obstetrics and Gynecology | Admitting: Obstetrics and Gynecology

## 2017-04-30 DIAGNOSIS — Z30016 Encounter for initial prescription of transdermal patch hormonal contraceptive device: Secondary | ICD-10-CM | POA: Diagnosis not present

## 2017-04-30 DIAGNOSIS — Z01419 Encounter for gynecological examination (general) (routine) without abnormal findings: Secondary | ICD-10-CM | POA: Diagnosis not present

## 2017-04-30 MED FILL — XULANE PATCH: 150-35 | 28 days supply | Qty: 3 | Fill #0

## 2017-05-01 ENCOUNTER — Encounter: Payer: 59 | Admitting: Family Medicine

## 2017-05-02 LAB — CYTOLOGY - PAP: DIAGNOSIS: NEGATIVE

## 2017-06-02 ENCOUNTER — Ambulatory Visit (INDEPENDENT_AMBULATORY_CARE_PROVIDER_SITE_OTHER): Payer: 59 | Admitting: Family Medicine

## 2017-06-02 ENCOUNTER — Encounter: Payer: Self-pay | Admitting: Family Medicine

## 2017-06-02 VITALS — BP 122/78 | HR 83 | Temp 98.0°F | Resp 14 | Ht 60.38 in | Wt 189.1 lb

## 2017-06-02 DIAGNOSIS — L83 Acanthosis nigricans: Secondary | ICD-10-CM | POA: Diagnosis not present

## 2017-06-02 DIAGNOSIS — Z0000 Encounter for general adult medical examination without abnormal findings: Secondary | ICD-10-CM | POA: Diagnosis not present

## 2017-06-02 DIAGNOSIS — E669 Obesity, unspecified: Secondary | ICD-10-CM

## 2017-06-02 DIAGNOSIS — D508 Other iron deficiency anemias: Secondary | ICD-10-CM

## 2017-06-02 NOTE — Progress Notes (Signed)
Patient ID: Linda Cole, female   DOB: 12-Sep-1985, 31 y.o.   MRN: [id number]   Subjective:   Linda Cole is a 31 y.o. female here for a complete physical exam  Interim issues since last visit: she has had heavy periods; switched to another contraceptive (patch) Hx of anemia Not a lot of green leafy vegetables Not sure if any anemia runs in the family Walks around the store and gets tired or dizzy; not really shaky, feels a little nervous Eating changes through the week  USPSTF grade A and B recommendations Depression:  Depression screen [facility] 2/9 06/02/2017 01/30/2017  Decreased Interest 0 0  Down, Depressed, Hopeless 0 0  PHQ - 2 Score 0 0   Hypertension: BP Readings from Last 3 Encounters:  06/02/17 122/78  01/30/17 118/76  09/16/13 (!) 135/93   Obesity: Wt Readings from Last 3 Encounters:  06/02/17 189 lb 1.6 oz (85.8 kg)  01/30/17 188 lb 6.4 oz (85.5 kg)  09/13/13 176 lb (79.8 kg)   BMI Readings from Last 3 Encounters:  06/02/17 36.47 kg/m  01/30/17 36.79 kg/m  09/13/13 34.37 kg/m     Skin cancer: no worrisome moles; skin tags Lung cancer:  n/a Breast cancer: no lumps, sees GYN, just had yearly with GYN last month Colorectal cancer: no fam hx  BRCA gene screening: family hx of breast and/or ovarian cancer and/or metastatic prostate cancer? no Cervical cancer screening: through GYN HIV, hep B, hep C: not intersted STD testing and prevention (chl/gon/syphilis): not interested Intimate partner violence: no abuse Contraception:  Osteoporosis: n/a Fall prevention/vitamin D: discussedn/a  Diet: will try to increase fruits and veggies Exercise: has a standing desk Alcohol: less than 7 Tobacco use: never smoker Aspirin: n/a Lipids: check fasting; elevated LDL No results found for: CHOL No results found for: HDL No results found for: LDLCALC No results found for: TRIG No results found for: CHOLHDL No results found for: LDLDIRECT Glucose: trail mix, baked  cheetos and cookies Glucose, Bld  Date Value Ref Range Status  09/13/2013 133 (H) 70 - 99 mg/dL Final  09/03/2013 81 70 - 99 mg/dL Final    Past Medical History:  Diagnosis Date  . Anemia   . Hypokalemia   . Pregnancy induced hypertension   . Sickle cell trait ([place])   . Vaginal Pap smear, abnormal    Past Surgical History:  Procedure Laterality Date  . NO PAST SURGERIES     Family History  Problem Relation Age of Onset  . Diabetes Maternal Grandfather   . Hypertension Paternal Grandmother   . Diabetes Paternal Grandmother   . COPD Paternal Grandmother   . Hypertension Paternal Grandfather   . Diabetes Paternal Grandfather   . COPD Paternal Grandfather   . Hyperlipidemia Mother   . Kidney disease Father        on dyalsis  . Eczema Daughter   . COPD Maternal Grandmother   . Hypertension Maternal Grandmother    Social History   Tobacco Use  . Smoking status: Never Smoker  . Smokeless tobacco: Never Used  Substance Use Topics  . Alcohol use: Yes    Comment: social   Review of Systems  Eyes: Positive for visual disturbance (blurred vision).  Gastrointestinal: Negative for blood in stool.  Genitourinary: Negative for hematuria.    Objective:   Vitals:   06/02/17 1330  BP: 122/78  Pulse: 83  Resp: 14  Temp: 98 F (36.7 C)  TempSrc: Oral  SpO2: 96%  Weight: 189  lb 1.6 oz (85.8 kg)  Height: 5' 0.38" (1.534 m)   Body mass index is 36.47 kg/m. Wt Readings from Last 3 Encounters:  06/02/17 189 lb 1.6 oz (85.8 kg)  01/30/17 188 lb 6.4 oz (85.5 kg)  09/13/13 176 lb (79.8 kg)   Physical Exam  Constitutional: She appears well-developed and well-nourished.  HENT:  Head: Normocephalic and atraumatic.  Right Ear: Hearing, tympanic membrane, external ear and ear canal normal.  Left Ear: Hearing, tympanic membrane, external ear and ear canal normal.  Eyes: Conjunctivae and EOM are normal. Right eye exhibits no hordeolum. Left eye exhibits no hordeolum. No  scleral icterus.  Neck: Carotid bruit is not present. No thyromegaly present.  Cardiovascular: Normal rate, regular rhythm, S1 normal, S2 normal and normal heart sounds.  No extrasystoles are present.  Pulmonary/Chest: Effort normal and breath sounds normal. No respiratory distress.  Abdominal: Soft. Normal appearance and bowel sounds are normal. She exhibits no distension, no abdominal bruit, no pulsatile midline mass and no mass. There is no hepatosplenomegaly. There is no tenderness. No hernia.  Musculoskeletal: Normal range of motion. She exhibits no edema.  Lymphadenopathy:       Head (right side): No submandibular adenopathy present.       Head (left side): No submandibular adenopathy present.    She has no cervical adenopathy.    She has no axillary adenopathy.  Neurological: She is alert. She displays no tremor. No cranial nerve deficit. She exhibits normal muscle tone. Gait normal.  Reflex Scores:      Patellar reflexes are 2+ on the right side and 2+ on the left side. Skin: Skin is warm and dry. No bruising noted. No cyanosis. No pallor.  Hyperpigmented skin on the nape of the neck, folds/creases are spared; few small skin tags around neck as well, 1x1 mm on right side lateral neck  Psychiatric: Her speech is normal and behavior is normal. Thought content normal. Her mood appears not anxious. She does not exhibit a depressed mood.   Assessment/Plan:   Problem List Items Addressed This Visit      Musculoskeletal and Integument   Acanthosis nigricans    Explained that this can be a sign of insulin resistance; she will try to lose weight and eat better, less sweet tea, stand more at work, etc        Other   Anemia   Relevant Orders   Fe+TIBC+Fer   Preventative health care - Primary    USPSTF grade A and B recommendations reviewed with patient; age-appropriate recommendations, preventive care, screening tests, etc discussed and encouraged; healthy living encouraged; see AVS for  patient education given to patient      Relevant Orders   CBC with Differential/Platelet   COMPLETE METABOLIC PANEL WITH GFR   Lipid panel   TSH   Hemoglobin A1c   Obesity (BMI 35.0-39.9 without comorbidity)    Encouragement given; more water, stand more, walk more, healthier eating; she declined additional help/resources and will try harder          Meds ordered this encounter  Medications  . XULANE 150-35 MCG/24HR transdermal patch    Sig: Apply 1 patch once a week topically.    Refill:  5   Orders Placed This Encounter  Procedures  . CBC with Differential/Platelet  . COMPLETE METABOLIC PANEL WITH GFR  . Lipid panel  . TSH  . Fe+TIBC+Fer  . Hemoglobin A1c   Follow up plan: Return in about 1 year (around 06/02/2018)  for complete physical.  An After Visit Summary was printed and given to the patient.

## 2017-06-02 NOTE — Assessment & Plan Note (Signed)
USPSTF grade A and B recommendations reviewed with patient; age-appropriate recommendations, preventive care, screening tests, etc discussed and encouraged; healthy living encouraged; see AVS for patient education given to patient  

## 2017-06-02 NOTE — Assessment & Plan Note (Signed)
Encouragement given; more water, stand more, walk more, healthier eating; she declined additional help/resources and will try harder

## 2017-06-02 NOTE — Patient Instructions (Addendum)
We'll get labs today If you have not heard anything from my staff in a week about any orders/referrals/studies from today, please contact us here to follow-up [phone]  Health Maintenance, Female Adopting a healthy lifestyle and getting preventive care can go a long way to promote health and wellness. Talk with your health care provider about what schedule of regular examinations is right for you. This is a good chance for you to check in with your provider about disease prevention and staying healthy. In between checkups, there are plenty of things you can do on your own. Experts have done a lot of research about which lifestyle changes and preventive measures are most likely to keep you healthy. Ask your health care provider for more information. Weight and diet Eat a healthy diet  Be sure to include plenty of vegetables, fruits, low-fat dairy products, and lean protein.  Do not eat a lot of foods high in solid fats, added sugars, or salt.  Get regular exercise. This is one of the most important things you can do for your health. ? Most adults should exercise for at least 150 minutes each week. The exercise should increase your heart rate and make you sweat (moderate-intensity exercise). ? Most adults should also do strengthening exercises at least twice a week. This is in addition to the moderate-intensity exercise.  Maintain a healthy weight  Body mass index (BMI) is a measurement that can be used to identify possible weight problems. It estimates body fat based on height and weight. Your health care provider can help determine your BMI and help you achieve or maintain a healthy weight.  For females 20 years of age and older: ? A BMI below 18.5 is considered underweight. ? A BMI of 18.5 to 24.9 is normal. ? A BMI of 25 to 29.9 is considered overweight. ? A BMI of 30 and above is considered obese.  Watch levels of cholesterol and blood lipids  You should start having your blood  tested for lipids and cholesterol at 29 years of age, then have this test every 5 years.  You may need to have your cholesterol levels checked more often if: ? Your lipid or cholesterol levels are high. ? You are older than 29 years of age. ? You are at high risk for heart disease.  Cancer screening Lung Cancer  Lung cancer screening is recommended for adults 55-80 years old who are at high risk for lung cancer because of a history of smoking.  A yearly low-dose CT scan of the lungs is recommended for people who: ? Currently smoke. ? Have quit within the past 15 years. ? Have at least a 30-pack-year history of smoking. A pack year is smoking an average of one pack of cigarettes a day for 1 year.  Yearly screening should continue until it has been 15 years since you quit.  Yearly screening should stop if you develop a health problem that would prevent you from having lung cancer treatment.  Breast Cancer  Practice breast self-awareness. This means understanding how your breasts normally appear and feel.  It also means doing regular breast self-exams. Let your health care provider know about any changes, no matter how small.  If you are in your 20s or 30s, you should have a clinical breast exam (CBE) by a health care provider every 1-3 years as part of a regular health exam.  If you are 40 or older, have a CBE every year. Also consider having a breast   X-ray (mammogram) every year.  If you have a family history of breast cancer, talk to your health care provider about genetic screening.  If you are at high risk for breast cancer, talk to your health care provider about having an MRI and a mammogram every year.  Breast cancer gene (BRCA) assessment is recommended for women who have family members with BRCA-related cancers. BRCA-related cancers include: ? Breast. ? Ovarian. ? Tubal. ? Peritoneal cancers.  Results of the assessment will determine the need for genetic counseling and  BRCA1 and BRCA2 testing.  Cervical Cancer Your health care provider may recommend that you be screened regularly for cancer of the pelvic organs (ovaries, uterus, and vagina). This screening involves a pelvic examination, including checking for microscopic changes to the surface of your cervix (Pap test). You may be encouraged to have this screening done every 3 years, beginning at age 41.  For women ages 53-65, health care providers may recommend pelvic exams and Pap testing every 3 years, or they may recommend the Pap and pelvic exam, combined with testing for human papilloma virus (HPV), every 5 years. Some types of HPV increase your risk of cervical cancer. Testing for HPV may also be done on women of any age with unclear Pap test results.  Other health care providers may not recommend any screening for nonpregnant women who are considered low risk for pelvic cancer and who do not have symptoms. Ask your health care provider if a screening pelvic exam is right for you.  If you have had past treatment for cervical cancer or a condition that could lead to cancer, you need Pap tests and screening for cancer for at least 20 years after your treatment. If Pap tests have been discontinued, your risk factors (such as having a new sexual partner) need to be reassessed to determine if screening should resume. Some women have medical problems that increase the chance of getting cervical cancer. In these cases, your health care provider may recommend more frequent screening and Pap tests.  Colorectal Cancer  This type of cancer can be detected and often prevented.  Routine colorectal cancer screening usually begins at 29 years of age and continues through 29 years of age.  Your health care provider may recommend screening at an earlier age if you have risk factors for colon cancer.  Your health care provider may also recommend using home test kits to check for hidden blood in the stool.  A small camera  at the end of a tube can be used to examine your colon directly (sigmoidoscopy or colonoscopy). This is done to check for the earliest forms of colorectal cancer.  Routine screening usually begins at age 69.  Direct examination of the colon should be repeated every 5-10 years through 29 years of age. However, you may need to be screened more often if early forms of precancerous polyps or small growths are found.  Skin Cancer  Check your skin from head to toe regularly.  Tell your health care provider about any new moles or changes in moles, especially if there is a change in a mole's shape or color.  Also tell your health care provider if you have a mole that is larger than the size of a pencil eraser.  Always use sunscreen. Apply sunscreen liberally and repeatedly throughout the day.  Protect yourself by wearing long sleeves, pants, a wide-brimmed hat, and sunglasses whenever you are outside.  Heart disease, diabetes, and high blood pressure  High blood  pressure causes heart disease and increases the risk of stroke. High blood pressure is more likely to develop in: ? People who have blood pressure in the high end of the normal range (130-139/85-89 mm Hg). ? People who are overweight or obese. ? People who are African American.  If you are 67-37 years of age, have your blood pressure checked every 3-5 years. If you are 29 years of age or older, have your blood pressure checked every year. You should have your blood pressure measured twice-once when you are at a hospital or clinic, and once when you are not at a hospital or clinic. Record the average of the two measurements. To check your blood pressure when you are not at a hospital or clinic, you can use: ? An automated blood pressure machine at a pharmacy. ? A home blood pressure monitor.  If you are between 6 years and 14 years old, ask your health care provider if you should take aspirin to prevent strokes.  Have regular diabetes  screenings. This involves taking a blood sample to check your fasting blood sugar level. ? If you are at a normal weight and have a low risk for diabetes, have this test once every three years after 29 years of age. ? If you are overweight and have a high risk for diabetes, consider being tested at a younger age or more often. Preventing infection Hepatitis B  If you have a higher risk for hepatitis B, you should be screened for this virus. You are considered at high risk for hepatitis B if: ? You were born in a country where hepatitis B is common. Ask your health care provider which countries are considered high risk. ? Your parents were born in a high-risk country, and you have not been immunized against hepatitis B (hepatitis B vaccine). ? You have HIV or AIDS. ? You use needles to inject street drugs. ? You live with someone who has hepatitis B. ? You have had sex with someone who has hepatitis B. ? You get hemodialysis treatment. ? You take certain medicines for conditions, including cancer, organ transplantation, and autoimmune conditions.  Hepatitis C  Blood testing is recommended for: ? Everyone born from [birth year] through 1963. ? Anyone with known risk factors for hepatitis C.  Sexually transmitted infections (STIs)  You should be screened for sexually transmitted infections (STIs) including gonorrhea and chlamydia if: ? You are sexually active and are younger than 29 years of age. ? You are older than 29 years of age and your health care provider tells you that you are at risk for this type of infection. ? Your sexual activity has changed since you were last screened and you are at an increased risk for chlamydia or gonorrhea. Ask your health care provider if you are at risk.  If you do not have HIV, but are at risk, it may be recommended that you take a prescription medicine daily to prevent HIV infection. This is called pre-exposure prophylaxis (PrEP). You are considered at risk  if: ? You are sexually active and do not regularly use condoms or know the HIV status of your partner(s). ? You take drugs by injection. ? You are sexually active with a partner who has HIV.  Talk with your health care provider about whether you are at high risk of being infected with HIV. If you choose to begin PrEP, you should first be tested for HIV. You should then be tested every 3 months for as  long as you are taking PrEP. Pregnancy  If you are premenopausal and you may become pregnant, ask your health care provider about preconception counseling.  If you may become pregnant, take 400 to 800 micrograms (mcg) of folic acid every day.  If you want to prevent pregnancy, talk to your health care provider about birth control (contraception). Osteoporosis and menopause  Osteoporosis is a disease in which the bones lose minerals and strength with aging. This can result in serious bone fractures. Your risk for osteoporosis can be identified using a bone density scan.  If you are 94 years of age or older, or if you are at risk for osteoporosis and fractures, ask your health care provider if you should be screened.  Ask your health care provider whether you should take a calcium or vitamin D supplement to lower your risk for osteoporosis.  Menopause may have certain physical symptoms and risks.  Hormone replacement therapy may reduce some of these symptoms and risks. Talk to your health care provider about whether hormone replacement therapy is right for you. Follow these instructions at home:  Schedule regular health, dental, and eye exams.  Stay current with your immunizations.  Do not use any tobacco products including cigarettes, chewing tobacco, or electronic cigarettes.  If you are pregnant, do not drink alcohol.  If you are breastfeeding, limit how much and how often you drink alcohol.  Limit alcohol intake to no more than 1 drink per day for nonpregnant women. One drink  equals 12 ounces of beer, 5 ounces of wine, or 1 ounces of hard liquor.  Do not use street drugs.  Do not share needles.  Ask your health care provider for help if you need support or information about quitting drugs.  Tell your health care provider if you often feel depressed.  Tell your health care provider if you have ever been abused or do not feel safe at home. This information is not intended to replace advice given to you by your health care provider. Make sure you discuss any questions you have with your health care provider. Document Released: 01/28/2011 Document Revised: 12/21/2015 Document Reviewed: 04/18/2015 Elsevier Interactive Patient Education  [employer].

## 2017-06-02 NOTE — Assessment & Plan Note (Signed)
Explained that this can be a sign of insulin resistance; she will try to lose weight and eat better, less sweet tea, stand more at work, [employer]ater[employer] manageretc

## 2017-06-03 ENCOUNTER — Other Ambulatory Visit: Payer: Self-pay | Admitting: Family Medicine

## 2017-06-03 DIAGNOSIS — D500 Iron deficiency anemia secondary to blood loss (chronic): Secondary | ICD-10-CM

## 2017-06-03 LAB — COMPLETE METABOLIC PANEL WITH GFR
AG RATIO: 1.2 (calc) (ref 1.0–2.5)
ALT: 9 U/L (ref 6–29)
AST: 12 U/L (ref 10–30)
Albumin: 4 g/dL (ref 3.6–5.1)
Alkaline phosphatase (APISO): 116 U/L — ABNORMAL HIGH (ref 33–115)
BILIRUBIN TOTAL: 0.3 mg/dL (ref 0.2–1.2)
BUN / CREAT RATIO: 8 (calc) (ref 6–22)
BUN: 6 mg/dL — ABNORMAL LOW (ref 7–25)
CHLORIDE: 103 mmol/L (ref 98–110)
CO2: 27 mmol/L (ref 20–32)
Calcium: 9.2 mg/dL (ref 8.6–10.2)
Creat: 0.77 mg/dL (ref 0.50–1.10)
GFR, EST AFRICAN AMERICAN: 121 mL/min/{1.73_m2} (ref >=60)
GFR, Est Non African American: 104 mL/min/{1.73_m2} (ref >=60)
GLOBULIN: 3.4 g/dL (ref 1.9–3.7)
Glucose, Bld: 94 mg/dL (ref 65–99)
POTASSIUM: 3.9 mmol/L (ref 3.5–5.3)
Sodium: 136 mmol/L (ref 135–146)
TOTAL PROTEIN: 7.4 g/dL (ref 6.1–8.1)

## 2017-06-03 LAB — CBC WITH DIFFERENTIAL/PLATELET
Basophils Absolute: 32 cells/uL (ref 0–200)
Basophils Relative: 0.3 %
Eosinophils Absolute: 180 cells/uL (ref 15–500)
Eosinophils Relative: 1.7 %
HEMATOCRIT: 32.4 % — AB (ref 35.0–45.0)
Hemoglobin: 10.9 g/dL — ABNORMAL LOW (ref 11.7–15.5)
LYMPHS ABS: 2480 {cells}/uL (ref 850–3900)
MCH: 26.1 pg — AB (ref 27.0–33.0)
MCHC: 33.6 g/dL (ref 32.0–36.0)
MCV: 77.5 fL — AB (ref 80.0–100.0)
MPV: 10.5 fL (ref 7.5–12.5)
Monocytes Relative: 5.7 %
Neutro Abs: 7303 cells/uL (ref 1500–7800)
Neutrophils Relative %: 68.9 %
PLATELETS: 383 10*3/uL (ref 140–400)
RBC: 4.18 10*6/uL (ref 3.80–5.10)
RDW: 14.4 % (ref 11.0–15.0)
Total Lymphocyte: 23.4 %
WBC: 10.6 10*3/uL (ref 3.8–10.8)
WBCMIX: 604 {cells}/uL (ref 200–950)

## 2017-06-03 LAB — LIPID PANEL
Cholesterol: 183 mg/dL (ref <200)
HDL: 46 mg/dL — ABNORMAL LOW (ref >50)
LDL Cholesterol (Calc): 107 mg/dL (calc) — ABNORMAL HIGH
Non-HDL Cholesterol (Calc): 137 mg/dL (calc) — ABNORMAL HIGH (ref <130)
Total CHOL/HDL Ratio: 4 (calc) (ref <5.0)
Triglycerides: 186 mg/dL — ABNORMAL HIGH (ref <150)

## 2017-06-03 LAB — IRON,TIBC AND FERRITIN PANEL
%SAT: 5 % (calc) — ABNORMAL LOW (ref 11–50)
Ferritin: 11 ng/mL (ref 10–154)
Iron: 27 ug/dL — ABNORMAL LOW (ref 40–190)
TIBC: 500 mcg/dL (calc) — ABNORMAL HIGH (ref 250–450)

## 2017-06-03 LAB — HEMOGLOBIN A1C
EAG (MMOL/L): 6 (calc)
Hgb A1c MFr Bld: 5.4 % of total Hgb (ref <5.7)
MEAN PLASMA GLUCOSE: 108 (calc)

## 2017-06-03 LAB — TSH: TSH: 3.08 m[IU]/L

## 2017-06-03 NOTE — Progress Notes (Signed)
Ordered cbc and iron panel for 8 weeks from now

## 2017-06-16 MED FILL — XULANE PATCH: 150-35 | 28 days supply | Qty: 3 | Fill #1

## 2017-07-21 MED FILL — XULANE PATCH: 150-35 | 28 days supply | Qty: 3 | Fill #2

## 2017-09-03 ENCOUNTER — Ambulatory Visit
Admission: RE | Admit: 2017-09-03 | Discharge: 2017-09-03 | Disposition: A | Discharge status: Home / Self Care | Payer: 59 | Source: Ambulatory Visit | Attending: Family Medicine | Admitting: Family Medicine

## 2017-09-03 ENCOUNTER — Encounter: Payer: Self-pay | Admitting: Family Medicine

## 2017-09-03 ENCOUNTER — Ambulatory Visit: Payer: 59 | Admitting: Family Medicine

## 2017-09-03 VITALS — BP 126/80 | HR 87 | Temp 98.3°F | Ht 60.0 in | Wt 185.3 lb

## 2017-09-03 DIAGNOSIS — J069 Acute upper respiratory infection, unspecified: Secondary | ICD-10-CM | POA: Diagnosis not present

## 2017-09-03 DIAGNOSIS — R059 Cough, unspecified: Secondary | ICD-10-CM

## 2017-09-03 DIAGNOSIS — R058 Other specified cough: Secondary | ICD-10-CM

## 2017-09-03 DIAGNOSIS — R05 Cough: Secondary | ICD-10-CM

## 2017-09-03 MED ORDER — BENZONATATE 100 MG PO CAPS: 100.0000 mg | ORAL_CAPSULE | Freq: Three times a day (TID) | ORAL | 0 refills | Status: DC | PRN

## 2017-09-03 MED FILL — BENZONATATE 100 MG CAP: 100 | 10 days supply | Qty: 30 | Fill #0

## 2017-09-03 NOTE — Patient Instructions (Signed)
Try vitamin C (orange juice if not diabetic or vitamin C tablets) and drink green tea to help your immune system during your illness Get plenty of rest and hydration Have the chest xray done today Use the cough medicine if needed Plain mucinex to help loosen phlegm if needed

## 2017-09-03 NOTE — Progress Notes (Signed)
BP 126/80 (BP Location: Left Arm, Patient Position: Sitting, Cuff Size: Large)   Pulse 87   Temp 98.3 F (36.8 C) (Oral)   Ht 5' (1.524 m)   Wt 185 lb 4.8 oz (84.1 kg)   LMP 08/14/2017   SpO2 99%   BMI 36.19 kg/m    Subjective:    Patient ID: Linda Cole, female    DOB: June 28, 1986, 32 y.o.   MRN: [id number]  HPI: Linda Cole is a 32 y.o. female  Chief Complaint  Patient presents with  . Cough    x 3 weeks, sometimes productive, denies fever at home     HPI Patient is here for a sick visit Started with a common cold; congestion, runny nose, occasional cough; those symptoms went away except the cough; no fevers then Sometimes hacking and nonstop at night; sometimes productive and greenish; clearing throat sometimes No current fevers No travel; no visits to nursing home or hospital, but does visit daycare (daughter is in daycare) No wheezing; no rash No hx of pneumonia or bronchitis Tried hot tea, more water, tylenol cold and flu, benadryl at night Did flu shot this year Ears are okay, sinuses are okay No major changes at home environment, did change filters to see if that would help Her grandmother had TB and her mother visited her  Depression screen [facility] 2/9 09/03/2017 06/02/2017 01/30/2017  Decreased Interest 0 0 0  Down, Depressed, Hopeless 0 0 0  PHQ - 2 Score 0 0 0    Relevant past medical, surgical, family and social history reviewed Past Medical History:  Diagnosis Date  . Anemia   . Hypokalemia   . Pregnancy induced hypertension   . Sickle cell trait (HCC)   . Vaginal Pap smear, abnormal    Past Surgical History:  Procedure Laterality Date  . NO PAST SURGERIES     Family History  Problem Relation Age of Onset  . Diabetes Maternal Grandfather   . Hypertension Paternal Grandmother   . Diabetes Paternal Grandmother   . COPD Paternal Grandmother   . Hypertension Paternal Grandfather   . Diabetes Paternal Grandfather   . COPD Paternal Grandfather   .  Hyperlipidemia Mother   . Kidney disease Father        on dyalsis  . Eczema Daughter   . COPD Maternal Grandmother   . Hypertension Maternal Grandmother    Social History   Tobacco Use  . Smoking status: Never Smoker  . Smokeless tobacco: Never Used  Substance Use Topics  . Alcohol use: Yes    Comment: social  . Drug use: No    Interim medical history since last visit reviewed. Allergies and medications reviewed  Review of Systems Per HPI unless specifically indicated above     Objective:    BP 126/80 (BP Location: Left Arm, Patient Position: Sitting, Cuff Size: Large)   Pulse 87   Temp 98.3 F (36.8 C) (Oral)   Ht 5' (1.524 m)   Wt 185 lb 4.8 oz (84.1 kg)   LMP 08/14/2017   SpO2 99%   BMI 36.19 kg/m   Wt Readings from Last 3 Encounters:  09/03/17 185 lb 4.8 oz (84.1 kg)  06/02/17 189 lb 1.6 oz (85.8 kg)  01/30/17 188 lb 6.4 oz (85.5 kg)    Physical Exam  Constitutional: She appears well-developed and well-nourished.  HENT:  Right Ear: Tympanic membrane and ear canal normal. No middle ear effusion.  Left Ear: Tympanic membrane and ear canal normal.  No middle ear effusion.  Nose: Rhinorrhea (cloudy, scant) present. No mucosal edema.  Mouth/Throat: Oropharynx is clear and moist and mucous membranes are normal.  Eyes: EOM are normal. No scleral icterus.  Cardiovascular: Normal rate and regular rhythm.  Pulmonary/Chest: Effort normal. No accessory muscle usage. No respiratory distress. She has decreased breath sounds in the right upper field. She has no wheezes. She has no rhonchi.  Lymphadenopathy:    She has no cervical adenopathy.  Skin: She is not diaphoretic. No pallor.  Psychiatric: She has a normal mood and affect. Her behavior is normal.       Assessment & Plan:   Problem List Items Addressed This Visit    None    Visit Diagnoses    Cough    -  Primary   will get CXR to r/o pneumonia; if not getting better, contact me   Relevant Orders   DG  Chest 2 View   Sputum production       scant; not likely enough to warrant a worthwhile culture   Relevant Orders   DG Chest 2 View   Upper respiratory tract infection, unspecified type       likely started as viral URI; should resolve on its own; will check CXR today to r/o secondary pneumonia   Relevant Orders   DG Chest 2 View       Follow up plan: No Follow-up on file.  An after-visit summary was printed and given to the patient at check-out.  Please see the patient instructions which may contain other information and recommendations beyond what is mentioned above in the assessment and plan.  Meds ordered this encounter  Medications  . benzonatate (TESSALON PERLES) 100 MG capsule    Sig: Take 1 capsule (100 mg total) by mouth every 8 (eight) hours as needed for cough.    Dispense:  30 capsule    Refill:  0    Orders Placed This Encounter  Procedures  . DG Chest 2 View

## 2018-02-18 ENCOUNTER — Encounter: Payer: Self-pay | Admitting: Family Medicine

## 2018-03-14 ENCOUNTER — Other Ambulatory Visit: Payer: Self-pay

## 2018-03-14 ENCOUNTER — Encounter (HOSPITAL_BASED_OUTPATIENT_CLINIC_OR_DEPARTMENT_OTHER): Payer: Self-pay | Admitting: Emergency Medicine

## 2018-03-14 ENCOUNTER — Emergency Department (HOSPITAL_BASED_OUTPATIENT_CLINIC_OR_DEPARTMENT_OTHER)
Admission: EM | Admit: 2018-03-14 | Discharge: 2018-03-14 | Disposition: A | Discharge status: Home / Self Care | Payer: 59 | Attending: Emergency Medicine | Admitting: Emergency Medicine

## 2018-03-14 DIAGNOSIS — M62838 Other muscle spasm: Secondary | ICD-10-CM | POA: Insufficient documentation

## 2018-03-14 DIAGNOSIS — M542 Cervicalgia: Secondary | ICD-10-CM | POA: Diagnosis present

## 2018-03-14 MED ORDER — CYCLOBENZAPRINE HCL 5 MG PO TABS
5.0000 mg | ORAL_TABLET | Freq: Once | ORAL | Status: AC
  Administered 2018-03-14: 5 mg via ORAL
  Filled 2018-03-14: qty 1

## 2018-03-14 MED ORDER — DICLOFENAC SODIUM 1 % TD GEL: 4.0000 g | Freq: Four times a day (QID) | TRANSDERMAL | 0 refills | Status: DC

## 2018-03-14 MED ORDER — CYCLOBENZAPRINE HCL 5 MG PO TABS: 5.0000 mg | ORAL_TABLET | Freq: Three times a day (TID) | ORAL | 0 refills | Status: DC | PRN

## 2018-03-14 NOTE — ED Triage Notes (Signed)
Patient states that about 1 week ago she woke up with her neck "aching" - patient states that she has pain to the back of her neck and to the sides of her neck. Hurts worse with movement

## 2018-03-14 NOTE — ED Provider Notes (Signed)
[facility] EMERGENCY DEPARTMENT Provider Note   CSN: [id number] Arrival date & time: 03/14/18  1552     History   Chief Complaint Chief Complaint  Patient presents with  . Neck Pain    HPI Linda Cole is a 32 y.o. female hx of anemia, here with neck pain. She states that she woke up a week ago and then noticed L neck spasms. She states that it is worse with movement. Denies any numbness or tingling or weakness of the arms. Patient tried naprosyn, motrin with no relief. Denies any headache or fever. Denies any neck trauma.   The history is provided by the patient.    Past Medical History:  Diagnosis Date  . Anemia   . Hypokalemia   . Pregnancy induced hypertension   . Sickle cell trait (HCC)   . Vaginal Pap smear, abnormal     Patient Active Problem List   Diagnosis Date Noted  . Preventative health care 06/02/2017  . Acanthosis nigricans 06/02/2017  . Obesity (BMI 35.0-39.9 without comorbidity) 06/02/2017  . Sickle cell trait (HCC) 02/04/2017  . Hx of abnormal cervical Pap smear 02/04/2017  . Anemia 01/30/2017  . Elevated LDL cholesterol level 01/30/2017    Past Surgical History:  Procedure Laterality Date  . NO PAST SURGERIES       OB History    Gravida  3   Para  1   Term  1   Preterm      AB  1   Living  1     SAB  1   TAB      Ectopic      Multiple      Live Births  1            Home Medications    Prior to Admission medications   Medication Sig Start Date End Date Taking? Authorizing Provider  benzonatate (TESSALON PERLES) 100 MG capsule Take 1 capsule (100 mg total) by mouth every 8 (eight) hours as needed for cough. 09/03/17   Kerman PasseyLada, Melinda P, MD  Multiple Vitamin (MULTIVITAMIN) tablet Take 1 tablet by mouth daily.    [provider]    Family History Family History  Problem Relation Age of Onset  . Diabetes Maternal Grandfather   . Hypertension Paternal Grandmother   . Diabetes Paternal Grandmother   .  COPD Paternal Grandmother   . Hypertension Paternal Grandfather   . Diabetes Paternal Grandfather   . COPD Paternal Grandfather   . Hyperlipidemia Mother   . Kidney disease Father        on dyalsis  . Eczema Daughter   . COPD Maternal Grandmother   . Hypertension Maternal Grandmother     Social History Social History   Tobacco Use  . Smoking status: Never Smoker  . Smokeless tobacco: Never Used  Substance Use Topics  . Alcohol use: Yes    Comment: social  . Drug use: No     Allergies   Sulfa antibiotics   Review of Systems Review of Systems  Musculoskeletal: Positive for neck pain.  All other systems reviewed and are negative.    Physical Exam Updated Vital Signs BP (!) 136/97 (BP Location: Left Arm)   Pulse 70   Temp 98.1 F (36.7 C) (Oral)   Resp 18   Ht 5' (1.524 m)   Wt 83.9 kg   LMP 02/28/2018   SpO2 100%   BMI 36.13 kg/m   Physical Exam  Constitutional: She is  oriented to person, place, and time. She appears well-developed and well-nourished.  HENT:  Head: Normocephalic.  Eyes: Pupils are equal, round, and reactive to light. Conjunctivae and EOM are normal.  Neck:  + tenderness L side of the neck, + muscle spasms, no obvious midline tenderness. No meningeal signs   Cardiovascular: Normal rate, regular rhythm and normal heart sounds.  Pulmonary/Chest: Effort normal and breath sounds normal. No stridor. No respiratory distress.  Abdominal: Soft. Bowel sounds are normal. She exhibits no distension.  Musculoskeletal: Normal range of motion.  Neurological: She is alert and oriented to person, place, and time. No cranial nerve deficit. Coordination normal.  Nl strength throughout. CN 2- 12 intact   Skin: Skin is warm. Capillary refill takes less than 2 seconds.  Psychiatric: She has a normal mood and affect.  Nursing note and vitals reviewed.    ED Treatments / Results  Labs (all labs ordered are listed, but only abnormal results are  displayed) Labs Reviewed - No data to display  EKG None  Radiology No results found.  Procedures Procedures (including critical care time)  Medications Ordered in ED Medications  cyclobenzaprine (FLEXERIL) tablet 5 mg (has no administration in time range)     Initial Impression / Assessment and Plan / ED Course  I have reviewed the triage vital signs and the nursing notes.  Pertinent labs & imaging results that were available during my care of the patient were reviewed by me and considered in my medical decision making (see chart for details).     Linda Cole is a 32 y.o. female here with L neck pain. Likely mild torticollis. Nl neuro exam. No signs of radiculopathy and no meningeal signs. Afebrile. Will try flexeril, continue NSAIDs, add voltaren gel.    Final Clinical Impressions(s) / ED Diagnoses   Final diagnoses:  None    ED Discharge Orders    None       Charlynne PanderYao, Lynessa Almanzar Hsienta, MD 03/14/18 [phone]

## 2018-03-14 NOTE — Discharge Instructions (Signed)
Continue motrin or naprosyn for neck pain   Take flexeril for neck spasms   Try voltaren gel to the neck   See your doctor  Return to ER if you have worse neck pain, arm numbness or weakness, fever, headaches

## 2018-05-01 ENCOUNTER — Other Ambulatory Visit: Payer: Self-pay | Admitting: Obstetrics and Gynecology

## 2018-05-01 ENCOUNTER — Other Ambulatory Visit (HOSPITAL_COMMUNITY)
Admission: RE | Admit: 2018-05-01 | Discharge: 2018-05-01 | Disposition: A | Discharge status: Home / Self Care | Payer: 59 | Source: Ambulatory Visit | Attending: Obstetrics and Gynecology | Admitting: Obstetrics and Gynecology

## 2018-05-01 DIAGNOSIS — Z309 Encounter for contraceptive management, unspecified: Secondary | ICD-10-CM | POA: Diagnosis not present

## 2018-05-01 DIAGNOSIS — Z01419 Encounter for gynecological examination (general) (routine) without abnormal findings: Secondary | ICD-10-CM | POA: Diagnosis not present

## 2018-05-01 DIAGNOSIS — Z113 Encounter for screening for infections with a predominantly sexual mode of transmission: Secondary | ICD-10-CM | POA: Diagnosis not present

## 2018-05-01 DIAGNOSIS — Z01411 Encounter for gynecological examination (general) (routine) with abnormal findings: Secondary | ICD-10-CM | POA: Diagnosis not present

## 2018-05-01 DIAGNOSIS — N920 Excessive and frequent menstruation with regular cycle: Secondary | ICD-10-CM | POA: Diagnosis not present

## 2018-05-05 LAB — CYTOLOGY - PAP
Chlamydia: NEGATIVE
Diagnosis: NEGATIVE
HPV: NOT DETECTED
NEISSERIA GONORRHEA: NEGATIVE

## 2018-06-04 ENCOUNTER — Ambulatory Visit (INDEPENDENT_AMBULATORY_CARE_PROVIDER_SITE_OTHER): Payer: 59 | Admitting: Family Medicine

## 2018-06-04 ENCOUNTER — Encounter: Payer: Self-pay | Admitting: Family Medicine

## 2018-06-04 VITALS — BP 120/72 | HR 85 | Temp 99.1°F | Ht 60.0 in | Wt 179.6 lb

## 2018-06-04 DIAGNOSIS — Z0000 Encounter for general adult medical examination without abnormal findings: Secondary | ICD-10-CM | POA: Diagnosis not present

## 2018-06-04 DIAGNOSIS — D649 Anemia, unspecified: Secondary | ICD-10-CM | POA: Diagnosis not present

## 2018-06-04 NOTE — Patient Instructions (Signed)

## 2018-06-04 NOTE — Assessment & Plan Note (Signed)
USPSTF grade A and B recommendations reviewed with patient; age-appropriate recommendations, preventive care, screening tests, etc discussed and encouraged; healthy living encouraged; see AVS for patient education given to patient  

## 2018-06-04 NOTE — Progress Notes (Signed)
Patient ID: Tsosie Billing, female   DOB: [birth year], 33 y.o.   MRN: [id number]   Subjective:   Linda Cole is a 32 y.o. female here for a complete physical exam  Interim issues since last visit: saw gyn, had pap smear and found out pregnant; LMP 04/22/18, EDD around 01/27/19; will have official OB visit soon; seeing [facility] in [place]; on fusion plus; had low iron at appt with GYN  USPSTF grade A and B recommendations Depression:  Depression screen [facility] 2/9 06/04/2018 09/03/2017 06/02/2017 01/30/2017  Decreased Interest 0 0 0 0  Down, Depressed, Hopeless 0 0 0 0  PHQ - 2 Score 0 0 0 0  Altered sleeping 0 - - -  Tired, decreased energy 3 - - -  Change in appetite 0 - - -  Feeling bad or failure about yourself  0 - - -  Trouble concentrating 0 - - -  Moving slowly or fidgety/restless 0 - - -  Suicidal thoughts 0 - - -  PHQ-9 Score 3 - - -  Difficult doing work/chores Not difficult at all - - -   Hypertension: BP Readings from Last 3 Encounters:  06/04/18 120/72  03/14/18 (!) 136/97  09/03/17 126/80   Obesity: Wt Readings from Last 3 Encounters:  06/04/18 179 lb 9.6 oz (81.5 kg)  03/14/18 185 lb (83.9 kg)  09/03/17 185 lb 4.8 oz (84.1 kg)   BMI Readings from Last 3 Encounters:  06/04/18 35.08 kg/m  03/14/18 36.13 kg/m  09/03/17 36.19 kg/m    Skin cancer: nothing worrisome Lung cancer:  nonsmoker Breast cancer: through GYN; no lumps; just had CBE last months Colorectal cancer: no fam hx Cervical cancer screening: through GYN BRCA gene screening: family hx of breast and/or ovarian cancer and/or metastatic prostate cancer? no HIV, hep B, hep C: through OB STD testing and prevention (chl/gon/syphilis): through OB Intimate partner violence: no abuse Contraception: not now Osteoporosis: n/a Fall prevention/vitamin D: discussed, in PNV Immunizations: flu shot UTD Diet: drinking water now; nauseated now 75% of the day, snacking and grazing Exercise: had a trainer but has stopped with  pregnancy; that was intense; walking 20 minutes a day Alcohol: no amount of alcohol safe right now explained Tobacco use: non AAA: n/a Aspirin: no Glucose: just done through GYN Glucose, Bld  Date Value Ref Range Status  06/02/2017 94 65 - 99 mg/dL Final    Comment:    .            Fasting reference interval .   09/13/2013 133 (H) 70 - 99 mg/dL Final  09/03/2013 81 70 - 99 mg/dL Final   Lipids:  Lab Results  Component Value Date   CHOL 183 06/02/2017   Lab Results  Component Value Date   HDL 46 (L) 06/02/2017   Lab Results  Component Value Date   LDLCALC 107 (H) 06/02/2017   Lab Results  Component Value Date   TRIG 186 (H) 06/02/2017   Lab Results  Component Value Date   CHOLHDL 4.0 06/02/2017   No results found for: LDLDIRECT   Past Medical History:  Diagnosis Date  . Anemia   . Hypokalemia   . Pregnancy induced hypertension   . Sickle cell trait ([place])   . Vaginal Pap smear, abnormal    Past Surgical History:  Procedure Laterality Date  . NO PAST SURGERIES     Family History  Problem Relation Age of Onset  . Diabetes Maternal Grandfather   . Hypertension Paternal Grandmother   .  Diabetes Paternal Grandmother   . COPD Paternal Grandmother   . Hypertension Paternal Grandfather   . Diabetes Paternal Grandfather   . COPD Paternal Grandfather   . Hyperlipidemia Mother   . Kidney disease Father        on dyalsis  . Eczema Daughter   . COPD Maternal Grandmother   . Hypertension Maternal Grandmother    Social History   Tobacco Use  . Smoking status: Never Smoker  . Smokeless tobacco: Never Used  Substance Use Topics  . Alcohol use: Yes    Comment: social  . Drug use: No   Review of Systems  Gastrointestinal: Negative for blood in stool.  Genitourinary: Negative for hematuria.    Objective:   Vitals:   06/04/18 0839  BP: 120/72  Pulse: 85  Temp: 99.1 F (37.3 C)  TempSrc: Oral  SpO2: 94%  Weight: 179 lb 9.6 oz (81.5 kg)  Height:  5' (1.524 m)   Body mass index is 35.08 kg/m. Wt Readings from Last 3 Encounters:  06/04/18 179 lb 9.6 oz (81.5 kg)  03/14/18 185 lb (83.9 kg)  09/03/17 185 lb 4.8 oz (84.1 kg)   Physical Exam  Constitutional: She appears well-developed and well-nourished.  obese  HENT:  Head: Normocephalic and atraumatic.  Right Ear: Hearing, tympanic membrane, external ear and ear canal normal.  Left Ear: Hearing, tympanic membrane, external ear and ear canal normal.  Eyes: Conjunctivae and EOM are normal. Right eye exhibits no hordeolum. Left eye exhibits no hordeolum. No scleral icterus.  Neck: Carotid bruit is not present. No thyromegaly present.  Cardiovascular: Normal rate, regular rhythm, S1 normal, S2 normal and normal heart sounds.  No extrasystoles are present.  Pulmonary/Chest: Effort normal and breath sounds normal. No respiratory distress.  Abdominal: Soft. Normal appearance and bowel sounds are normal. She exhibits no distension, no abdominal bruit, no pulsatile midline mass and no mass. There is no hepatosplenomegaly. There is no tenderness. No hernia.  Musculoskeletal: Normal range of motion. She exhibits no edema.  Lymphadenopathy:       Head (right side): No submandibular adenopathy present.       Head (left side): No submandibular adenopathy present.    She has no cervical adenopathy.  Neurological: She is alert. She displays no tremor. No cranial nerve deficit. She exhibits normal muscle tone. Gait normal.  Reflex Scores:      Patellar reflexes are 2+ on the right side and 2+ on the left side. Skin: Skin is warm and dry. No bruising and no ecchymosis noted. No cyanosis. No pallor.  Psychiatric: Her speech is normal and behavior is normal. Thought content normal. Her mood appears not anxious. She does not exhibit a depressed mood.    Assessment/Plan:   Problem List Items Addressed This Visit      Other   Preventative health care - Primary    USPSTF grade A and B  recommendations reviewed with patient; age-appropriate recommendations, preventive care, screening tests, etc discussed and encouraged; healthy living encouraged; see AVS for patient education given to patient       Relevant Orders   COMPLETE METABOLIC PANEL WITH GFR   Lipid panel   TSH   CBC with Differential/Platelet       No orders of the defined types were placed in this encounter.  Orders Placed This Encounter  Procedures  . COMPLETE METABOLIC PANEL WITH GFR  . Lipid panel  . TSH  . CBC with Differential/Platelet    Follow up  plan: Return in about 1 year (around 06/05/2019) for complete physical.  An After Visit Summary was printed and given to the patient.

## 2018-06-05 ENCOUNTER — Telehealth: Payer: Self-pay

## 2018-06-05 DIAGNOSIS — D649 Anemia, unspecified: Secondary | ICD-10-CM

## 2018-06-05 NOTE — Telephone Encounter (Signed)
-----   Message from Kerman Passey, MD sent at 06/05/2018  4:27 PM EST ----- Please ADD ON ferritin, TIBC, iron, saturation panel; dx anemia

## 2018-06-08 DIAGNOSIS — Z3201 Encounter for pregnancy test, result positive: Secondary | ICD-10-CM | POA: Diagnosis not present

## 2018-06-08 DIAGNOSIS — Z3490 Encounter for supervision of normal pregnancy, unspecified, unspecified trimester: Secondary | ICD-10-CM | POA: Diagnosis not present

## 2018-06-09 ENCOUNTER — Emergency Department (HOSPITAL_COMMUNITY)
Admission: EM | Admit: 2018-06-09 | Discharge: 2018-06-09 | Disposition: A | Discharge status: Home / Self Care | Payer: 59 | Attending: Emergency Medicine | Admitting: Emergency Medicine

## 2018-06-09 ENCOUNTER — Other Ambulatory Visit: Payer: Self-pay

## 2018-06-09 ENCOUNTER — Encounter (HOSPITAL_COMMUNITY): Payer: Self-pay | Admitting: Emergency Medicine

## 2018-06-09 ENCOUNTER — Emergency Department (HOSPITAL_COMMUNITY): Payer: 59

## 2018-06-09 DIAGNOSIS — R0609 Other forms of dyspnea: Secondary | ICD-10-CM

## 2018-06-09 DIAGNOSIS — Z3A01 Less than 8 weeks gestation of pregnancy: Secondary | ICD-10-CM | POA: Diagnosis not present

## 2018-06-09 DIAGNOSIS — O9989 Other specified diseases and conditions complicating pregnancy, childbirth and the puerperium: Secondary | ICD-10-CM | POA: Diagnosis not present

## 2018-06-09 DIAGNOSIS — R0600 Dyspnea, unspecified: Secondary | ICD-10-CM | POA: Insufficient documentation

## 2018-06-09 DIAGNOSIS — R0602 Shortness of breath: Secondary | ICD-10-CM | POA: Diagnosis not present

## 2018-06-09 LAB — I-STAT TROPONIN, ED: Troponin i, poc: 0 ng/mL (ref 0.00–0.08)

## 2018-06-09 LAB — LIPID PANEL
CHOLESTEROL: 160 mg/dL (ref <200)
HDL: 45 mg/dL — AB (ref >50)
LDL Cholesterol (Calc): 90 mg/dL (calc)
Non-HDL Cholesterol (Calc): 115 mg/dL (calc) (ref <130)
Total CHOL/HDL Ratio: 3.6 (calc) (ref <5.0)
Triglycerides: 145 mg/dL (ref <150)

## 2018-06-09 LAB — COMPLETE METABOLIC PANEL WITH GFR
AG Ratio: 1.3 (calc) (ref 1.0–2.5)
ALKALINE PHOSPHATASE (APISO): 127 U/L — AB (ref 33–115)
ALT: 8 U/L (ref 6–29)
AST: 11 U/L (ref 10–30)
Albumin: 3.9 g/dL (ref 3.6–5.1)
BUN: 8 mg/dL (ref 7–25)
CO2: 24 mmol/L (ref 20–32)
Calcium: 9.3 mg/dL (ref 8.6–10.2)
Chloride: 102 mmol/L (ref 98–110)
Creat: 0.74 mg/dL (ref 0.50–1.10)
GFR, Est African American: 126 mL/min/{1.73_m2} (ref >=60)
GFR, Est Non African American: 109 mL/min/{1.73_m2} (ref >=60)
GLOBULIN: 3.1 g/dL (ref 1.9–3.7)
Glucose, Bld: 79 mg/dL (ref 65–139)
Potassium: 4 mmol/L (ref 3.5–5.3)
Sodium: 134 mmol/L — ABNORMAL LOW (ref 135–146)
Total Bilirubin: 0.4 mg/dL (ref 0.2–1.2)
Total Protein: 7 g/dL (ref 6.1–8.1)

## 2018-06-09 LAB — CBC WITH DIFFERENTIAL/PLATELET
Abs Immature Granulocytes: 0.04 10*3/uL (ref 0.00–0.07)
Basophils Absolute: 41 cells/uL (ref 0–200)
Basophils Absolute: 0.1 10*3/uL (ref 0.0–0.1)
Basophils Relative: 0.4 %
Basophils Relative: 1 %
EOS ABS: 245 {cells}/uL (ref 15–500)
Eosinophils Absolute: 0.2 10*3/uL (ref 0.0–0.5)
Eosinophils Relative: 2.4 %
Eosinophils Relative: 1 %
HCT: 36.1 % (ref 36.0–46.0)
HEMATOCRIT: 33.7 % — AB (ref 35.0–45.0)
Hemoglobin: 11.1 g/dL — ABNORMAL LOW (ref 11.7–15.5)
Hemoglobin: 11.3 g/dL — ABNORMAL LOW (ref 12.0–15.0)
IMMATURE GRANULOCYTES: 0 %
Lymphocytes Relative: 25 %
Lymphs Abs: 2203 cells/uL (ref 850–3900)
Lymphs Abs: 3.3 10*3/uL (ref 0.7–4.0)
MCH: 24.2 pg — ABNORMAL LOW (ref 27.0–33.0)
MCH: 23.8 pg — ABNORMAL LOW (ref 26.0–34.0)
MCHC: 32.9 g/dL (ref 32.0–36.0)
MCHC: 31.3 g/dL (ref 30.0–36.0)
MCV: 73.4 fL — ABNORMAL LOW (ref 80.0–100.0)
MCV: 76.2 fL — ABNORMAL LOW (ref 80.0–100.0)
MONOS PCT: 4.7 %
MPV: 10.3 fL (ref 7.5–12.5)
Monocytes Absolute: 0.7 10*3/uL (ref 0.1–1.0)
Monocytes Relative: 6 %
NEUTROS PCT: 67 %
NRBC: 0 % (ref 0.0–0.2)
Neutro Abs: 7232 cells/uL (ref 1500–7800)
Neutro Abs: 8.9 10*3/uL — ABNORMAL HIGH (ref 1.7–7.7)
Neutrophils Relative %: 70.9 %
PLATELETS: 377 10*3/uL (ref 140–400)
Platelets: 381 10*3/uL (ref 150–400)
RBC: 4.59 10*6/uL (ref 3.80–5.10)
RBC: 4.74 MIL/uL (ref 3.87–5.11)
RDW: 16.7 % — AB (ref 11.0–15.0)
RDW: 17.9 % — AB (ref 11.5–15.5)
Total Lymphocyte: 21.6 %
WBC: 10.2 10*3/uL (ref 3.8–10.8)
WBC: 13.2 10*3/uL — ABNORMAL HIGH (ref 4.0–10.5)
WBCMIX: 479 {cells}/uL (ref 200–950)

## 2018-06-09 LAB — BASIC METABOLIC PANEL
ANION GAP: 8 (ref 5–15)
BUN: 7 mg/dL (ref 6–20)
CALCIUM: 9.2 mg/dL (ref 8.9–10.3)
CO2: 22 mmol/L (ref 22–32)
Chloride: 102 mmol/L (ref 98–111)
Creatinine, Ser: 0.72 mg/dL (ref 0.44–1.00)
GFR calc Af Amer: >60 mL/min (ref >60)
Glucose, Bld: 88 mg/dL (ref 70–99)
Potassium: 3.4 mmol/L — ABNORMAL LOW (ref 3.5–5.1)
SODIUM: 132 mmol/L — AB (ref 135–145)

## 2018-06-09 LAB — IRON,TIBC AND FERRITIN PANEL
%SAT: 11 % (calc) — ABNORMAL LOW (ref 16–45)
Ferritin: 11 ng/mL — ABNORMAL LOW (ref 16–154)
Iron: 56 ug/dL (ref 40–190)
TIBC: 528 mcg/dL (calc) — ABNORMAL HIGH (ref 250–450)

## 2018-06-09 LAB — TEST AUTHORIZATION

## 2018-06-09 LAB — TSH
TSH: 1.97 mIU/L
TSH: 1.904 u[IU]/mL (ref 0.350–4.500)

## 2018-06-09 LAB — D-DIMER, QUANTITATIVE (NOT AT ARMC): D DIMER QUANT: 0.85 ug{FEU}/mL — AB (ref 0.00–0.50)

## 2018-06-09 MED ORDER — MORPHINE SULFATE (PF) 4 MG/ML IV SOLN: 4.0000 mg | Freq: Once | INTRAVENOUS | Status: DC

## 2018-06-09 MED ORDER — IOPAMIDOL (ISOVUE-370) INJECTION 76%
100.0000 mL | Freq: Once | INTRAVENOUS | Status: AC | PRN
  Administered 2018-06-09: 100 mL via INTRAVENOUS

## 2018-06-09 MED ORDER — IOPAMIDOL (ISOVUE-370) INJECTION 76%
INTRAVENOUS | Status: AC
  Filled 2018-06-09: qty 100

## 2018-06-09 NOTE — Discharge Instructions (Addendum)
Contact a health care provider if: °Your condition does not improve as soon as expected. °You have a hard time doing your normal activities, even after you rest. °You have new symptoms. °Get help right away if: °Your shortness of breath gets worse. °You have shortness of breath when you are resting. °You feel light-headed or you faint. °You have a cough that is not controlled with medicines. °You cough up blood. °You have pain with breathing. °You have pain in your chest, arms, shoulders, or abdomen. °You have a fever. °You cannot walk up stairs or exercise the way that you normally do. °

## 2018-06-09 NOTE — ED Notes (Signed)
Patient returned from CT

## 2018-06-09 NOTE — ED Notes (Signed)
Patient ambulated to bathroom without assistance. O2 sats m

## 2018-06-09 NOTE — ED Provider Notes (Signed)
[facility]   CSN: [id number] Arrival date & time: 06/09/18  [id number]     History   Chief Complaint Chief Complaint  Patient presents with  . Shortness of Breath    HPI Linda Cole is a 32 y.o. female with a pmh of IDA. [place] with 3 days of exertional dyspnea. Better with rest. She is currently [redacted] weeks pregnant. She denies Denies a pmh of DVT/PE or FHx of the same. She denies fever, cp, leg swelling. She denies cough or hemoptysis. She denies fever or wheezing. She has noticed more fatigue over the past 3 days. She denies recent trauma, confinement or travel  HPI  Past Medical History:  Diagnosis Date  . Anemia   . Hypokalemia   . Pregnancy induced hypertension   . Sickle cell trait (HCC)   . Vaginal Pap smear, abnormal     Patient Active Problem List   Diagnosis Date Noted  . Preventative health care 06/02/2017  . Acanthosis nigricans 06/02/2017  . Obesity (BMI 35.0-39.9 without comorbidity) 06/02/2017  . Sickle cell trait (HCC) 02/04/2017  . Hx of abnormal cervical Pap smear 02/04/2017  . Anemia 01/30/2017  . Elevated LDL cholesterol level 01/30/2017    Past Surgical History:  Procedure Laterality Date  . NO PAST SURGERIES       OB History    Gravida  4   Para  1   Term  1   Preterm      AB  1   Living  1     SAB  1   TAB      Ectopic      Multiple      Live Births  1            Home Medications    Prior to Admission medications   Medication Sig Start Date End Date Taking? Authorizing Provider  Iron-FA-B Cmp-C-Biot-Probiotic (FUSION PLUS) CAPS Take 1 capsule by mouth daily. 05/23/18   [provider]    Family History Family History  Problem Relation Age of Onset  . Diabetes Maternal Grandfather   . Hypertension Paternal Grandmother   . Diabetes Paternal Grandmother   . COPD Paternal Grandmother   . Hypertension Paternal Grandfather   . Diabetes Paternal  Grandfather   . COPD Paternal Grandfather   . Hyperlipidemia Mother   . Kidney disease Father        on dyalsis  . Eczema Daughter   . COPD Maternal Grandmother   . Hypertension Maternal Grandmother     Social History Social History   Tobacco Use  . Smoking status: Never Smoker  . Smokeless tobacco: Never Used  Substance Use Topics  . Alcohol use: Yes    Comment: social  . Drug use: No     Allergies   Sulfa antibiotics and Sulfamethoxazole   Review of Systems Review of Systems  Ten systems reviewed and are negative for acute change, except as noted in the HPI.   Physical Exam Updated Vital Signs BP 125/76 (BP Location: Right Arm)   Pulse 69   Temp 98.4 F (36.9 C) (Oral)   Resp (!) 22   Ht 5' (1.524 m)   Wt 81 kg   LMP 04/22/2018 Comment: 7 weeks   SpO2 100%   BMI 34.88 kg/m   Physical Exam  Constitutional: She is oriented to person, place, and time. She appears well-developed and well-nourished. No distress.  HENT:  Head: Normocephalic and atraumatic.  Eyes: Pupils are equal, round, and reactive to light. Conjunctivae and EOM are normal. No scleral icterus.  Neck: Normal range of motion.  Cardiovascular: Normal rate, regular rhythm and normal heart sounds. Exam reveals no gallop and no friction rub.  No murmur heard. Pulmonary/Chest: Effort normal and breath sounds normal. No respiratory distress.  Abdominal: Soft. Bowel sounds are normal. She exhibits no distension and no mass. There is no tenderness. There is no guarding.  Neurological: She is alert and oriented to person, place, and time.  Skin: Skin is warm and dry. She is not diaphoretic.  Psychiatric: Her behavior is normal.  Nursing note and vitals reviewed.    ED Treatments / Results  Labs (all labs ordered are listed, but only abnormal results are displayed) Labs Reviewed - No data to display  EKG None  Radiology No results found.  Procedures Procedures (including critical care  time)  Medications Ordered in ED Medications - No data to display   Initial Impression / Assessment and Plan / ED Course  I have reviewed the triage vital signs and the nursing notes.  Pertinent labs & imaging results that were available during my care of the patient were reviewed by me and considered in my medical decision making (see chart for details).  Clinical Course as of Jun 09 2334  Tue Jun 09, 2018  2200 TSH: 1.904 [AH]    Clinical Course User Index [AH] Arthor Captain, PA-C    32 y/o f who is currently [redacted] weeks pregnant.  Excess presenting for exertional dyspnea. The emergent differential diagnosis for shortness of breath includes, but is not limited to, Pulmonary edema, bronchoconstriction, Pneumonia, Pulmonary embolism, Pneumotherax/ Hemothorax, Dysrythmia, ACS.  Her EKG is without abnormality.  Normal chest x-ray.  Labs are within normal limits.  Patient had an elevated d-dimer however CT Angie was negative.  I had a long discussion with the patient about radiation risks.  She agreed to proceed with CT angiogram.  Her CT angios is negative she does not appear to have any acute cause of her dyspnea.  Patient appears appropriate for discharge at this time with close follow-up with her OB/GYN.  I discussed return precautions.  Final Clinical Impressions(s) / ED Diagnoses   Final diagnoses:  Exertional dyspnea    ED Discharge Orders    None       Arthor Captain, PA-C 06/09/18 2341    Mesner, Barbara Cower, MD 06/09/18 [phone]

## 2018-06-09 NOTE — ED Triage Notes (Signed)
Pt arrives to ED from home with complaints of shortness of breath over the last couple of days. Pt reports that she is [redacted] weeks pregnant. Pt denies any CP or any other symptoms. Pt placed in position of comfort with bed locked and lowered, call bell in reach.

## 2018-06-09 NOTE — ED Notes (Signed)
Patient verbalizes understanding of medications and discharge instructions. No further questions at this time. VSS and patient ambulatory at discharge.   

## 2018-06-11 ENCOUNTER — Encounter: Payer: Self-pay | Admitting: Family Medicine

## 2018-06-12 NOTE — Telephone Encounter (Signed)
I personally called patient and reviewed all of her labs with her She did not get to see those through [facility] I tried to release them tonight If they don't appear, she will notify IT  She has had heavy periods prior to her pregnancy and has sickle cell trait; her OB-GYN is aware and just drew more labs; she knows about the test results and has started her on iron therapy; I suggested that this be rechecked in a month or so (OB-GYN's discretion) to make sure iron therapy is working Patient went to ER, evaluated for [facility]; no PE; OB-GYN thinks anemia may play a part Alk phos slightly increased, placenta?, she can talk to OB-GYN to see if that's what that is Cholesterol improved; congratulated her on that

## 2018-06-16 LAB — OB RESULTS CONSOLE HEPATITIS B SURFACE ANTIGEN: Hepatitis B Surface Ag: NEGATIVE

## 2018-06-16 LAB — OB RESULTS CONSOLE RPR: RPR: NONREACTIVE

## 2018-06-16 LAB — OB RESULTS CONSOLE RUBELLA ANTIBODY, IGM: Rubella: IMMUNE

## 2018-07-13 DIAGNOSIS — Z3481 Encounter for supervision of other normal pregnancy, first trimester: Secondary | ICD-10-CM | POA: Diagnosis not present

## 2018-07-13 LAB — OB RESULTS CONSOLE HIV ANTIBODY (ROUTINE TESTING): HIV: NONREACTIVE

## 2018-07-29 NOTE — L&D Delivery Note (Signed)
Delivery Note Called for delivery when pt was 7-8 cm but she delivered minutes later,  precipitously before my arrival. Upon my arrival, baby in warmer, NICU team in attendance.  Baby with strong cry.  Mother supine with placenta in place.   At 9:44 AM a viable female was delivered via Vaginal, Spontaneous (Presentation: per RN ).  APGAR: 8, ; weight 5 lb 5.4 oz (2420 g).   Placenta status: spontaneous, intact.  Cord:  with the following complications:Per RN .  Cord pH: n/a  Anesthesia:  Epidural Episiotomy: None Lacerations: None Suture Repair: n/a Est. Blood Loss (mL): 150  Mom to Antepartum.  Baby to Couplet care / Skin to Skin.  [name] 12/25/2018, 10:31 AM

## 2018-09-20 ENCOUNTER — Ambulatory Visit (INDEPENDENT_AMBULATORY_CARE_PROVIDER_SITE_OTHER): Payer: Self-pay | Admitting: Family Medicine

## 2018-09-20 ENCOUNTER — Encounter: Payer: Self-pay | Admitting: Family Medicine

## 2018-09-20 VITALS — BP 114/70 | HR 99 | Temp 98.6°F | Resp 20 | Wt 188.6 lb

## 2018-09-20 DIAGNOSIS — H1031 Unspecified acute conjunctivitis, right eye: Secondary | ICD-10-CM

## 2018-09-20 MED ORDER — ERYTHROMYCIN 5 MG/GM OP OINT: 1.0000 "application " | TOPICAL_OINTMENT | Freq: Four times a day (QID) | OPHTHALMIC | 0 refills | Status: AC

## 2018-09-20 NOTE — Patient Instructions (Signed)
Bacterial Conjunctivitis, Adult  Bacterial conjunctivitis is an infection of your conjunctiva. This is the clear membrane that covers the white part of your eye and the inner part of your eyelid. This infection can make your eye:  · Red or pink.  · Itchy.  This condition spreads easily from person to person (is contagious) and from one eye to the other eye.  What are the causes?  · This condition is caused by germs (bacteria). You may get the infection if you come into close contact with:  ? A person who has the infection.  ? Items that have germs on them (are contaminated), such as face towels, contact lens solution, or eye makeup.  What increases the risk?  You are more likely to get this condition if you:  · Have contact with people who have the infection.  · Wear contact lenses.  · Have a sinus infection.  · Have had a recent eye injury or surgery.  · Have a weak body defense system (immune system).  · Have dry eyes.  What are the signs or symptoms?    · Thick, yellowish discharge from the eye.  · Tearing or watery eyes.  · Itchy eyes.  · Burning feeling in your eyes.  · Eye redness.  · Swollen eyelids.  · Blurred vision.  How is this treated?    · Antibiotic eye drops or ointment.  · Antibiotic medicine taken by mouth. This is used for infections that do not get better with drops or ointment or that last more than 10 days.  · Cool, wet cloths placed on the eyes.  · Artificial tears used 2-6 times a day.  Follow these instructions at home:  Medicines  · Take or apply your antibiotic medicine as told by your doctor. Do not stop taking or applying the antibiotic even if you start to feel better.  · Take or apply over-the-counter and prescription medicines only as told by your doctor.  · Do not touch your eyelid with the eye-drop bottle or the ointment tube.  Managing discomfort  · Wipe any fluid from your eye with a warm, wet washcloth or a cotton ball.  · Place a clean, cool, wet cloth on your eye. Do this for  10-20 minutes, 3-4 times per day.  General instructions  · Do not wear contacts until the infection is gone. Wear glasses until your doctor says it is okay to wear contacts again.  · Do not wear eye makeup until the infection is gone. Throw away old eye makeup.  · Change or wash your pillowcase every day.  · Do not share towels or washcloths.  · Wash your hands often with soap and water. Use paper towels to dry your hands.  · Do not touch or rub your eyes.  · Do not drive or use heavy machinery if your vision is blurred.  Contact a doctor if:  · You have a fever.  · You do not get better after 10 days.  Get help right away if:  · You have a fever and your symptoms get worse all of a sudden.  · You have very bad pain when you move your eye.  · Your face:  ? Hurts.  ? Is red.  ? Is swollen.  · You have sudden loss of vision.  Summary  · Bacterial conjunctivitis is an infection of your conjunctiva.  · This infection spreads easily from person to person.  · Wash your hands often   with soap and water. Use paper towels to dry your hands.  · Take or apply your antibiotic medicine as told by your doctor.  · Contact a doctor if you have a fever or you do not get better after 10 days.  This information is not intended to replace advice given to you by your health care provider. Make sure you discuss any questions you have with your health care provider.  Document Released: 04/23/2008 Document Revised: 02/18/2018 Document Reviewed: 02/18/2018  [employer]vier Interactive Patient Education © 2019 [employer]

## 2018-09-20 NOTE — Progress Notes (Signed)
Linda Cole is a 33 y.o. female who presents today with 2 or more weeks of right eye tearing. She reports that this happens occasionally in depending on what make up she uses, she is unsure of what particular makeup is causing the symptoms. She does not report using any medications to treat this condition and denies any known household or social contacts with similar symptoms. She reports that in the last 24 hours she noticed a white discharge, and crusting in the morning. She denies any pain, blurred vision or light sensitivity and reports that her symptoms are limited to the right eye only. She did use a electronic visit for treatment but was prescribed ofloxicin which she reports when she looked up did not feel if was safe to use because she is [redacted] weeks pregnant at this time. She is also complaining of ear pain and reports that her daughter who is 3 has cold like symptoms. She is followed by Deboraha Sprang for OB and PCP and reports that she is generally healthy and not taking any medications at this time.   Review of Systems  Constitutional: Negative for chills, fever and malaise/fatigue.  HENT: Negative for congestion, ear discharge, ear pain, sinus pain and sore throat.   Eyes: Positive for discharge. Negative for blurred vision, double vision, photophobia, pain and redness.  Respiratory: Negative for cough, sputum production and shortness of breath.   Cardiovascular: Negative.  Negative for chest pain.  Gastrointestinal: Negative for abdominal pain, diarrhea, nausea and vomiting.  Genitourinary: Negative for dysuria, frequency, hematuria and urgency.  Musculoskeletal: Negative for myalgias.  Skin: Negative.  Negative for itching and rash.  Neurological: Negative for headaches.  Endo/Heme/Allergies: Negative.   Psychiatric/Behavioral: Negative.     Reitha has a current medication list which includes the following prescription(s): fusion plus and erythromycin. Also is allergic to sulfa antibiotics and  sulfamethoxazole.  Qunita  has a past medical history of Anemia, Hypokalemia, Pregnancy induced hypertension, Sickle cell trait (HCC), and Vaginal Pap smear, abnormal. Also  has a past surgical history that includes No past surgeries.    O: Vitals:   09/20/18 1209  BP: 114/70  Pulse: (!) 115  Resp: 20  Temp: 99.5 F (37.5 C)  SpO2: 99%     Physical Exam Vitals signs (reviewed-repeated WNL) reviewed.  Constitutional:      General: She is not in acute distress.    Appearance: Normal appearance. She is well-developed. She is not ill-appearing, toxic-appearing or diaphoretic.  HENT:     Head: Normocephalic.     Right Ear: Hearing, tympanic membrane, ear canal and external ear normal. No middle ear effusion. Tympanic membrane is not injected or erythematous.     Left Ear: Hearing, tympanic membrane, ear canal and external ear normal.  No middle ear effusion. Tympanic membrane is not injected or erythematous.     Nose: Nose normal.     Right Sinus: No maxillary sinus tenderness or frontal sinus tenderness.     Left Sinus: No maxillary sinus tenderness or frontal sinus tenderness.     Mouth/Throat:     Lips: Pink.     Mouth: Mucous membranes are moist.     Pharynx: Uvula midline.     Tonsils: No tonsillar exudate or tonsillar abscesses.  Eyes:     General: Lids are normal. Vision grossly intact. No allergic shiner, visual field deficit or scleral icterus.    Extraocular Movements: Extraocular movements intact.     Conjunctiva/sclera: Conjunctivae normal.     Right  eye: Right conjunctiva is not injected. Exudate present. No chemosis or hemorrhage.    Left eye: Left conjunctiva is not injected. No chemosis, exudate or hemorrhage.    Pupils: Pupils are equal, round, and reactive to light.     Comments: Left eye- WNL unaffected- no noted abnormalities Right eye- evidence of tearing during exam and residual crusting to eye lashes- no thick white discharge appreciated on exam.  Neck:      Musculoskeletal: Normal range of motion and neck supple.  Cardiovascular:     Rate and Rhythm: Normal rate and regular rhythm.     Pulses: Normal pulses.     Heart sounds: Normal heart sounds.  Pulmonary:     Effort: Pulmonary effort is normal.     Breath sounds: Normal breath sounds.  Abdominal:     General: Bowel sounds are normal.     Palpations: Abdomen is soft.  Musculoskeletal: Normal range of motion.  Lymphadenopathy:     Head:     Right side of head: No submental or submandibular adenopathy.     Left side of head: No submental or submandibular adenopathy.     Cervical: No cervical adenopathy.  Skin:    General: Skin is warm.  Neurological:     Mental Status: She is alert and oriented to person, place, and time.  Psychiatric:        Behavior: Behavior is cooperative.    A: 1. Acute conjunctivitis of right eye, unspecified acute conjunctivitis type    P: Discussed the potential for this to be allergy related and watchful waiting was suggested. She declined this option and wants to initiate treatment for bacterial infection. Risk vs. Benefits and pregnancy categories of treatment options- discussed erythromycin QID x 5 days- pregnancy category B. Also informed patient to follow up with OB/GYN for future health related concern until cleared by OB post-delivery due to scope of services. 1. Acute conjunctivitis of right eye, unspecified acute conjunctivitis type - erythromycin ophthalmic ointment; Place 1 application into the right eye 4 (four) times daily for 5 days.   Discussed with patient exam findings, suspected diagnosis etiology and  reviewed recommended treatment plan and follow up, including complications and indications for urgent medical follow up and evaluation. Medications including use and indications reviewed with patient. Patient provided relevant patient education on diagnosis and/or relevant related condition that were discussed and reviewed with patient at  discharge. Patient verbalized understanding of information provided and agrees with plan of care (POC), all questions answered.

## 2018-09-22 ENCOUNTER — Telehealth: Payer: Self-pay

## 2018-09-22 NOTE — Telephone Encounter (Signed)
LM on pt vm regarding how she is doing since her visit with [place].

## 2018-10-08 ENCOUNTER — Other Ambulatory Visit: Payer: Self-pay

## 2018-10-13 MED FILL — POTASSIUM CHLORIDE CRYS ER: 20 | 2 days supply | Qty: 12 | Fill #0

## 2018-12-19 ENCOUNTER — Other Ambulatory Visit: Payer: Self-pay

## 2018-12-19 ENCOUNTER — Encounter (HOSPITAL_COMMUNITY): Payer: Self-pay

## 2018-12-19 ENCOUNTER — Observation Stay (HOSPITAL_COMMUNITY)
Admission: AD | Admit: 2018-12-19 | Discharge: 2018-12-22 | Disposition: A | Discharge status: Home / Self Care | Payer: No Typology Code available for payment source | Source: Ambulatory Visit | Attending: Obstetrics and Gynecology | Admitting: Obstetrics and Gynecology

## 2018-12-19 DIAGNOSIS — R109 Unspecified abdominal pain: Secondary | ICD-10-CM | POA: Diagnosis not present

## 2018-12-19 DIAGNOSIS — O163 Unspecified maternal hypertension, third trimester: Secondary | ICD-10-CM | POA: Diagnosis not present

## 2018-12-19 DIAGNOSIS — O133 Gestational [pregnancy-induced] hypertension without significant proteinuria, third trimester: Secondary | ICD-10-CM | POA: Diagnosis present

## 2018-12-19 DIAGNOSIS — Z1159 Encounter for screening for other viral diseases: Secondary | ICD-10-CM | POA: Insufficient documentation

## 2018-12-19 DIAGNOSIS — Z3A34 34 weeks gestation of pregnancy: Secondary | ICD-10-CM | POA: Insufficient documentation

## 2018-12-19 LAB — URINALYSIS, ROUTINE W REFLEX MICROSCOPIC
Bacteria, UA: NONE SEEN
Bilirubin Urine: NEGATIVE
Glucose, UA: NEGATIVE mg/dL
Hgb urine dipstick: NEGATIVE
Ketones, ur: NEGATIVE mg/dL
Nitrite: NEGATIVE
Protein, ur: NEGATIVE mg/dL
Specific Gravity, Urine: 1.006 (ref 1.005–1.030)
pH: 7 (ref 5.0–8.0)

## 2018-12-19 MED ORDER — LABETALOL HCL 5 MG/ML IV SOLN
20.0000 mg | INTRAVENOUS | Status: DC | PRN
  Administered 2018-12-19: 20 mg via INTRAVENOUS
  Filled 2018-12-19: qty 4

## 2018-12-19 MED ORDER — LABETALOL HCL 5 MG/ML IV SOLN: 80.0000 mg | INTRAVENOUS | Status: DC | PRN

## 2018-12-19 MED ORDER — LABETALOL HCL 5 MG/ML IV SOLN: 40.0000 mg | INTRAVENOUS | Status: DC | PRN

## 2018-12-19 MED ORDER — HYDRALAZINE HCL 20 MG/ML IJ SOLN: 10.0000 mg | INTRAMUSCULAR | Status: DC | PRN

## 2018-12-19 NOTE — MAU Provider Note (Addendum)
History     CSN: [id number] Arrival date and time: 12/19/18 2148  First Provider Initiated Contact with Patient 12/19/18 2345    Chief Complaint  Patient presents with  . Hypertension  . Decreased Fetal Movement   HPI Mrs. Danelle Earthlyoel is a 33yo G3P1011 at 3371w4d who presents from home with elevated blood pressures, swelling in hands, and decreased fetal movement. States felt off today and noticed some swelling in her hands so decided to get a blood pressure cuff today. When she checked her blood pressure at home, it was elevated to 169/114. She then rechecked it several times to confirm and also checked other family members blood pressures to ensure not related to cuff.   Denies headaches, blurry vision, shortness of breath, abdominal pain. States some swelling in hands and legs. Reports some decreased fetal movement for past few days but feels normal movement now. No history of blood pressure issues in or outside of pregnancy.   OB History    Gravida  3   Para  1   Term  1   Preterm      AB  1   Living  1     SAB  1   TAB      Ectopic      Multiple      Live Births  1           Past Medical History:  Diagnosis Date  . Anemia   . Hypokalemia   . Pregnancy induced hypertension   . Sickle cell trait (HCC)   . Vaginal Pap smear, abnormal     Past Surgical History:  Procedure Laterality Date  . NO PAST SURGERIES      Family History  Problem Relation Age of Onset  . Diabetes Maternal Grandfather   . Hypertension Paternal Grandmother   . Diabetes Paternal Grandmother   . COPD Paternal Grandmother   . Hypertension Paternal Grandfather   . Diabetes Paternal Grandfather   . COPD Paternal Grandfather   . Hyperlipidemia Mother   . Kidney disease Father        on dyalsis  . Eczema Daughter   . COPD Maternal Grandmother   . Hypertension Maternal Grandmother     Social History   Tobacco Use  . Smoking status: Never Smoker  . Smokeless tobacco: Never Used   Substance Use Topics  . Alcohol use: Not Currently    Comment: social  . Drug use: No    Allergies:  Allergies  Allergen Reactions  . Sulfa Antibiotics Other (See Comments)    Child hood allergy, unknown reaction  . Sulfamethoxazole     Unknown; childhood reaction. Patient states she thinks it was a rash    Medications Prior to Admission  Medication Sig Dispense Refill Last Dose  . Prenatal Vit-Fe Fumarate-FA (MULTIVITAMIN-PRENATAL) 27-0.8 MG TABS tablet Take 1 tablet by mouth daily at 12 noon.   12/19/2018 at Unknown time  . Iron-FA-B Cmp-C-Biot-Probiotic (FUSION PLUS) CAPS Take 1 capsule by mouth daily.   Taking    Review of Systems  Constitutional: Positive for fatigue. Negative for activity change.  Eyes: Negative for visual disturbance.  Respiratory: Negative for cough and shortness of breath.   Cardiovascular: Positive for leg swelling. Negative for chest pain and palpitations.  Gastrointestinal: Negative for abdominal pain, nausea and vomiting.  Genitourinary: Negative for dysuria, pelvic pain, vaginal bleeding and vaginal discharge.  Musculoskeletal: Negative for back pain.  Neurological: Negative for dizziness, light-headedness and headaches.  Psychiatric/Behavioral: Negative  for sleep disturbance. The patient is not nervous/anxious.    Physical Exam   Blood pressure (!) 144/81, pulse 88, temperature 98.5 F (36.9 C), temperature source Oral, resp. rate 17, height 5' (1.524 m), weight 86.2 kg, last menstrual period 04/22/2018, SpO2 99 %, unknown if currently breastfeeding.  Physical Exam  Nursing note and vitals reviewed. Constitutional: She is oriented to person, place, and time. She appears well-developed and well-nourished. No distress.  HENT:  Head: Normocephalic and atraumatic.  Mouth/Throat: Oropharynx is clear and moist.  Eyes: Conjunctivae and EOM are normal. No scleral icterus.  Cardiovascular: Normal rate, regular rhythm, normal heart sounds and intact  distal pulses.  No murmur heard. Respiratory: Effort normal and breath sounds normal. She has no wheezes.  GI: Soft. There is no abdominal tenderness. There is no guarding.  Genitourinary:    Genitourinary Comments: deferred   Musculoskeletal:        General: Edema (1+ non-pitting LE edema b/l, slight increased swelling of hands) present.  Neurological: She is alert and oriented to person, place, and time. No cranial nerve deficit.  Skin: Skin is warm and dry. No rash noted.  Psychiatric: She has a normal mood and affect. Her behavior is normal.    MAU Course  Procedures  MDM -- moderate range BP on arrival, will check CBC, CMP and UPC -- three severe range BP in a row, will treat with IV labetalol -- plt 171  AST/ALT wnl, Cr 0.46, K+ 2.6 - will replete with 40mg  po  UPC 0.15 -- given severe range pressures needing IV therapy, currently meets criteria for severe preeclampsia - discussed with Dr. Macon Large who recommends admission for monitoring BP and possible induction with Mg++ -- BP normal to moderate range after IV labetalol x 1 - called to discuss with Dr. Dion Body who requests observation of BP in antepartum unit, BMZ, IV hydralazine protocol -- confirmed with NICU given high census that okay with admission, they are in agreement -- patient remains asymptomatic, discussed results and recommendation to admit for BP monitoring for 24-48 hours and she is in agreement, orders placed for admission  -- NST reactive and reassuring: 140-150s/mod/+a/-d; irritability, irregular  Assessment and Plan  33yo G3P1011 at [redacted]w[redacted]d admitted to antepartum for elevated blood pressure in pregnancy concerning for gestational hypertension versus preeclampsia. Dr. Dion Body to see patient in morning.   Tamera Stands, DO 12/20/2018, 3:32 AM

## 2018-12-19 NOTE — MAU Note (Signed)
Pt c/o swelling in feet and hands for 2 weeks.  Also has had decreased fetal movement for 1 week. Told provider this on Thursday. Came in because she took BP at home w/wrist cuff 169/114. Had GTHN in last pregnancy. No headache, no vision changes, +FM now.

## 2018-12-20 ENCOUNTER — Observation Stay (HOSPITAL_BASED_OUTPATIENT_CLINIC_OR_DEPARTMENT_OTHER): Payer: No Typology Code available for payment source

## 2018-12-20 ENCOUNTER — Encounter (HOSPITAL_COMMUNITY): Payer: Self-pay | Admitting: *Deleted

## 2018-12-20 DIAGNOSIS — O99013 Anemia complicating pregnancy, third trimester: Secondary | ICD-10-CM | POA: Diagnosis not present

## 2018-12-20 DIAGNOSIS — Z3A34 34 weeks gestation of pregnancy: Secondary | ICD-10-CM | POA: Diagnosis not present

## 2018-12-20 DIAGNOSIS — Z862 Personal history of diseases of the blood and blood-forming organs and certain disorders involving the immune mechanism: Secondary | ICD-10-CM

## 2018-12-20 DIAGNOSIS — O163 Unspecified maternal hypertension, third trimester: Secondary | ICD-10-CM | POA: Diagnosis present

## 2018-12-20 LAB — TYPE AND SCREEN
ABO/RH(D): O POS
Antibody Screen: NEGATIVE

## 2018-12-20 LAB — SARS CORONAVIRUS 2 BY RT PCR (HOSPITAL ORDER, PERFORMED IN ~~LOC~~ HOSPITAL LAB): SARS Coronavirus 2: NEGATIVE

## 2018-12-20 LAB — PROTEIN / CREATININE RATIO, URINE
Creatinine, Urine: 58.86 mg/dL
Creatinine, Urine: 38.55 mg/dL
Protein Creatinine Ratio: 0.15 mg/mg{Cre} (ref 0.00–0.15)
Total Protein, Urine: 9 mg/dL
Total Protein, Urine: <6 mg/dL

## 2018-12-20 LAB — COMPREHENSIVE METABOLIC PANEL
ALT: 12 U/L (ref 0–44)
ALT: 13 U/L (ref 0–44)
AST: 17 U/L (ref 15–41)
AST: 19 U/L (ref 15–41)
Albumin: 2.4 g/dL — ABNORMAL LOW (ref 3.5–5.0)
Albumin: 2.5 g/dL — ABNORMAL LOW (ref 3.5–5.0)
Alkaline Phosphatase: 115 U/L (ref 38–126)
Alkaline Phosphatase: 133 U/L — ABNORMAL HIGH (ref 38–126)
Anion gap: 10 (ref 5–15)
Anion gap: 8 (ref 5–15)
BUN: <5 mg/dL — ABNORMAL LOW (ref 6–20)
BUN: <5 mg/dL — ABNORMAL LOW (ref 6–20)
CO2: 23 mmol/L (ref 22–32)
CO2: 23 mmol/L (ref 22–32)
Calcium: 8.7 mg/dL — ABNORMAL LOW (ref 8.9–10.3)
Calcium: 8.8 mg/dL — ABNORMAL LOW (ref 8.9–10.3)
Chloride: 103 mmol/L (ref 98–111)
Chloride: 103 mmol/L (ref 98–111)
Creatinine, Ser: 0.46 mg/dL (ref 0.44–1.00)
Creatinine, Ser: 0.56 mg/dL (ref 0.44–1.00)
GFR calc Af Amer: >60 mL/min (ref >60)
GFR calc Af Amer: >60 mL/min (ref >60)
GFR calc non Af Amer: >60 mL/min (ref >60)
GFR calc non Af Amer: >60 mL/min (ref >60)
Glucose, Bld: 112 mg/dL — ABNORMAL HIGH (ref 70–99)
Glucose, Bld: 88 mg/dL (ref 70–99)
Potassium: 2.6 mmol/L — CL (ref 3.5–5.1)
Potassium: 2.8 mmol/L — ABNORMAL LOW (ref 3.5–5.1)
Sodium: 134 mmol/L — ABNORMAL LOW (ref 135–145)
Sodium: 136 mmol/L (ref 135–145)
Total Bilirubin: 0.7 mg/dL (ref 0.3–1.2)
Total Bilirubin: 0.9 mg/dL (ref 0.3–1.2)
Total Protein: 5.6 g/dL — ABNORMAL LOW (ref 6.5–8.1)
Total Protein: 5.9 g/dL — ABNORMAL LOW (ref 6.5–8.1)

## 2018-12-20 LAB — CBC
HCT: 32.6 % — ABNORMAL LOW (ref 36.0–46.0)
HCT: 34 % — ABNORMAL LOW (ref 36.0–46.0)
Hemoglobin: 10.9 g/dL — ABNORMAL LOW (ref 12.0–15.0)
Hemoglobin: 11.4 g/dL — ABNORMAL LOW (ref 12.0–15.0)
MCH: 28.5 pg (ref 26.0–34.0)
MCH: 28.8 pg (ref 26.0–34.0)
MCHC: 33.4 g/dL (ref 30.0–36.0)
MCHC: 33.5 g/dL (ref 30.0–36.0)
MCV: 85.3 fL (ref 80.0–100.0)
MCV: 85.9 fL (ref 80.0–100.0)
Platelets: 167 10*3/uL (ref 150–400)
Platelets: 171 10*3/uL (ref 150–400)
RBC: 3.82 MIL/uL — ABNORMAL LOW (ref 3.87–5.11)
RBC: 3.96 MIL/uL (ref 3.87–5.11)
RDW: 15 % (ref 11.5–15.5)
RDW: 15.1 % (ref 11.5–15.5)
WBC: 10.4 10*3/uL (ref 4.0–10.5)
WBC: 11.1 10*3/uL — ABNORMAL HIGH (ref 4.0–10.5)
nRBC: 0 % (ref 0.0–0.2)
nRBC: 0 % (ref 0.0–0.2)

## 2018-12-20 LAB — CREATININE, SERUM
Creatinine, Ser: 0.54 mg/dL (ref 0.44–1.00)
GFR calc Af Amer: >60 mL/min (ref >60)
GFR calc non Af Amer: >60 mL/min (ref >60)

## 2018-12-20 LAB — ABO/RH: ABO/RH(D): O POS

## 2018-12-20 LAB — OB RESULTS CONSOLE GBS: GBS: NEGATIVE

## 2018-12-20 LAB — GROUP B STREP BY PCR: Group B strep by PCR: NEGATIVE

## 2018-12-20 MED ORDER — ZOLPIDEM TARTRATE 5 MG PO TABS: 5.0000 mg | ORAL_TABLET | Freq: Every evening | ORAL | Status: DC | PRN

## 2018-12-20 MED ORDER — LABETALOL HCL 5 MG/ML IV SOLN: 40.0000 mg | INTRAVENOUS | Status: DC | PRN

## 2018-12-20 MED ORDER — CALCIUM CARBONATE ANTACID 500 MG PO CHEW
2.0000 | CHEWABLE_TABLET | ORAL | Status: DC | PRN
  Administered 2018-12-20 – 2018-12-21 (×2): 400 mg via ORAL
  Filled 2018-12-20 (×2): qty 2

## 2018-12-20 MED ORDER — LABETALOL HCL 5 MG/ML IV SOLN: 20.0000 mg | INTRAVENOUS | Status: DC | PRN

## 2018-12-20 MED ORDER — SODIUM CHLORIDE 0.9 % IV SOLN
INTRAVENOUS | Status: DC | PRN
  Administered 2018-12-20: 250 mL via INTRAVENOUS

## 2018-12-20 MED ORDER — HYDRALAZINE HCL 20 MG/ML IJ SOLN: 5.0000 mg | INTRAMUSCULAR | Status: DC | PRN

## 2018-12-20 MED ORDER — HYDRALAZINE HCL 20 MG/ML IJ SOLN: 10.0000 mg | INTRAMUSCULAR | Status: DC | PRN

## 2018-12-20 MED ORDER — BETAMETHASONE SOD PHOS & ACET 6 (3-3) MG/ML IJ SUSP
12.0000 mg | INTRAMUSCULAR | Status: AC
  Administered 2018-12-20 – 2018-12-21 (×2): 12 mg via INTRAMUSCULAR
  Filled 2018-12-20 (×2): qty 2

## 2018-12-20 MED ORDER — POTASSIUM CHLORIDE CRYS ER 20 MEQ PO TBCR
40.0000 meq | EXTENDED_RELEASE_TABLET | Freq: Four times a day (QID) | ORAL | Status: AC
  Administered 2018-12-20 – 2018-12-21 (×5): 40 meq via ORAL
  Filled 2018-12-20 (×5): qty 2

## 2018-12-20 MED ORDER — PRENATAL MULTIVITAMIN CH
1.0000 | ORAL_TABLET | Freq: Every day | ORAL | Status: DC
  Administered 2018-12-20 – 2018-12-22 (×3): 1 via ORAL
  Filled 2018-12-20 (×3): qty 1

## 2018-12-20 MED ORDER — POTASSIUM CHLORIDE CRYS ER 20 MEQ PO TBCR
40.0000 meq | EXTENDED_RELEASE_TABLET | Freq: Once | ORAL | Status: DC
  Administered 2018-12-20: 40 meq via ORAL
  Filled 2018-12-20: qty 2

## 2018-12-20 MED ORDER — POTASSIUM CHLORIDE 10 MEQ/100ML IV SOLN
10.0000 meq | INTRAVENOUS | Status: AC
  Administered 2018-12-20 (×3): 10 meq via INTRAVENOUS
  Filled 2018-12-20 (×3): qty 100

## 2018-12-20 MED ORDER — ENOXAPARIN SODIUM 40 MG/0.4ML ~~LOC~~ SOLN
40.0000 mg | SUBCUTANEOUS | Status: DC
  Administered 2018-12-20 – 2018-12-21 (×2): 40 mg via SUBCUTANEOUS
  Filled 2018-12-20 (×2): qty 0.4

## 2018-12-20 MED ORDER — ACETAMINOPHEN 325 MG PO TABS: 650.0000 mg | ORAL_TABLET | ORAL | Status: DC | PRN

## 2018-12-20 MED ORDER — DOCUSATE SODIUM 100 MG PO CAPS
100.0000 mg | ORAL_CAPSULE | Freq: Every day | ORAL | Status: DC
  Administered 2018-12-20 – 2018-12-21 (×2): 100 mg via ORAL
  Filled 2018-12-20 (×2): qty 1

## 2018-12-20 NOTE — Progress Notes (Signed)
CRITICAL VALUE ALERT  Critical Value:  2.6 potassium  Date & Time Notied:  0056  Provider Notified: Dr. Earlene Plater Orders Received/Actions taken: continue to monitor. No new orders at this time

## 2018-12-20 NOTE — Plan of Care (Signed)
  Problem: Education: °Goal: Knowledge of disease or condition will improve °Outcome: Completed/Met °  °

## 2018-12-20 NOTE — H&P (Signed)
Linda Cole is a 33 y.o. female G3 P1011 @ 34 4/7 weeks admitted due to hypertension. Pt took her BP at home with a wrist cuff, b/c her hands were swelling,which read at 160s/110s.   Pt denied headaches, visual changes or abdominal pain at that time.  On arrival to MAU, BPs were in the severe range.  The highest BP 159/111.  Pt received Labetalol 20 mg IV once and BPs normalized or were mildly elevated.  This morning, Pt without complaints.  Denies headaches, visual changes, RUQ pain.  When I touched fundus, pt was uncomfortable but over liver edge pt denied pain.  Pt denies painful contractions.  PNC has been uncomplicated.  Pt had one episode of high normal BP.  Due to her h/o Gestational HTN in a previous pregnancy, baseline preeclampsia labs were done at that time, which were normal.  BPs subsequently, remained in the 110s/70s.   OB History    Gravida  3   Para  1   Term  1   Preterm      AB  1   Living  1     SAB  1   TAB      Ectopic      Multiple      Live Births  1          Past Medical History:  Diagnosis Date  . Anemia   . Hypokalemia   . Pregnancy induced hypertension   . Sickle cell trait (HCC)   . Vaginal Pap smear, abnormal    Past Surgical History:  Procedure Laterality Date  . NO PAST SURGERIES     Family History: family history includes COPD in her maternal grandmother, paternal grandfather, and paternal grandmother; Diabetes in her maternal grandfather, paternal grandfather, and paternal grandmother; Eczema in her daughter; Hyperlipidemia in her mother; Hypertension in her maternal grandmother, paternal grandfather, and paternal grandmother; Kidney disease in her father. Social History:  reports that she has never smoked. She has never used smokeless tobacco. She reports previous alcohol use. She reports that she does not use drugs.     Maternal Diabetes: No Genetic Screening: Normal Maternal Ultrasounds/Referrals: Normal Fetal Ultrasounds or  other Referrals:  None Maternal Substance Abuse:  No Significant Maternal Medications:  None Significant Maternal Lab Results:  None Other Comments:  Hypertension  Review of Systems  Respiratory: Negative for shortness of breath.   Gastrointestinal: Positive for abdominal pain.       Discomfort with fetal movement  Neurological: Negative for headaches.   Maternal Medical History:  Prenatal complications: no prenatal complications Prenatal Complications - Diabetes: none.    Dilation: Fingertip Effacement (%): Thick Exam by:: Dr. Dion BodyVarnado Blood pressure 132/62, pulse 92, temperature 97.9 F (36.6 C), temperature source Oral, resp. rate 18, height 5' (1.524 m), weight 87 kg, last menstrual period 04/22/2018, SpO2 98 %, unknown if currently breastfeeding. Maternal Exam:  Abdomen: Fetal presentation: vertex  Introitus: Normal vulva. Pelvis: adequate for delivery.   Cervix: Cervix evaluated by digital exam.   Soft, posterior, long thick, FT  Fetal Exam Fetal Monitor Review: Mode: fetoscope.   Baseline rate: 130s.  Variability: moderate (6-25 bpm).   Pattern: accelerations present and no decelerations.    Fetal State Assessment: Category I - tracings are normal.     Physical Exam  Constitutional: She is oriented to person, place, and time. She appears well-developed and well-nourished.  HENT:  Head: Normocephalic and atraumatic.  Eyes: EOM are normal.  Neck: Normal range  of motion.  Respiratory: Effort normal. No respiratory distress.  GI: There is abdominal tenderness.  At palpation of fundus  Genitourinary:    Vulva normal.   Musculoskeletal: Normal range of motion.        General: Edema present.  Neurological: She is alert and oriented to person, place, and time.  Skin: Skin is warm and dry.  Psychiatric: She has a normal mood and affect.    CBC    Component Value Date/Time   WBC 11.1 (H) 12/20/2018 0455   RBC 3.82 (L) 12/20/2018 0455   HGB 10.9 (L)  12/20/2018 0455   HCT 32.6 (L) 12/20/2018 0455   PLT 167 12/20/2018 0455   MCV 85.3 12/20/2018 0455   MCH 28.5 12/20/2018 0455   MCHC 33.4 12/20/2018 0455   RDW 15.1 12/20/2018 0455   LYMPHSABS 3.3 06/09/2018 1903   MONOABS 0.7 06/09/2018 1903   EOSABS 0.2 06/09/2018 1903   BASOSABS 0.1 06/09/2018 1903   CMP     Component Value Date/Time   NA 136 12/19/2018 2349   K 2.6 (LL) 12/19/2018 2349   CL 103 12/19/2018 2349   CO2 23 12/19/2018 2349   GLUCOSE 88 12/19/2018 2349   BUN <5 (L) 12/19/2018 2349   CREATININE 0.54 12/20/2018 0455   CREATININE 0.74 06/04/2018 0917   CALCIUM 8.8 (L) 12/19/2018 2349   PROT 5.9 (L) 12/19/2018 2349   ALBUMIN 2.5 (L) 12/19/2018 2349   AST 19 12/19/2018 2349   ALT 13 12/19/2018 2349   ALKPHOS 133 (H) 12/19/2018 2349   BILITOT 0.9 12/19/2018 2349   GFRNONAA >60 12/20/2018 0455   GFRNONAA 109 06/04/2018 0917   GFRAA >60 12/20/2018 0455   GFRAA 126 06/04/2018 0917   PCR 0.15  Prenatal labs: ABO, Rh: --/--/O POS, O POS Performed at [facility], [address], [place], [place] [address]  [phone]/23 2359) Antibody: NEG (05/23 2359) Rubella: Immune (11/19 0000) RPR: Nonreactive (11/19 0000)  HBsAg: Negative (11/19 0000)  HIV: Non-reactive (12/16 0000)  GBS:   Pending  Assessment/Plan: IUP @ 34 4/7 weeks  Hypertension:  Gest HTN vs. Preeclampsia  -Pt got Labetalol 20 mg IV x1 and now BPs are normal to mildly elevated.  Pt is asymptomatic, labs are normal.  -Continue observation.  If severe range BPs recur, plan for delivery.  Pt informed that due to full NICU, she may need to be  Transferred if delivery indicated.  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  -Repeat labs this evening.  -S/p BMZ x 1.  Complete series this evening. Hypokalemia  S/p po potassium.  Start Kruns.  Recheck CMP 6 hours s/p infusion. Fetal wellbeing  Category I tracing.  Order ultrasound to assess fetal wellbeing-AFI, growth. Contractions.  Not in  labor.  Continue observation. GBS-Collected GBS in the event of IOL due to Preeclampsia with severe features.   [name] 12/20/2018, 11:08 AM   Caren Macadam [phone], [phone]

## 2018-12-20 NOTE — Progress Notes (Signed)
Patient ID: Linda Cole, female   DOB: 1986/05/16, 33 y.o.   MRN: [id number]  Pt denies headache, visual changes, upper abdominal pain.  C/o left hand pain due to K runs.  Contractions have decreased.  BP (!) 154/103 Comment: RN notified  Pulse (!) 117   Temp 98.2 F (36.8 C) (Oral)   Resp 18   Ht 5' (1.524 m)   Wt 87 kg   LMP 04/22/2018 Comment: 7 weeks   SpO2 100%   BMI 37.45 kg/m    Gen: NAD Abd:  No RUQ pain  Category I tracing.  A/p   Hypertension  Likely mild Preeclampsia.  Repeat PCR.  If severe range pressures noted, proceed with delivery.  Pt understands she may need to be transferred Hypokalemia  Start po Kdur. GBS negative. F/u ultrasound. Awaiting official report but Images reviewed.    AFI appears adequate  Vertex. Fetal wellbeing.  BMZ #2 overnight.

## 2018-12-21 LAB — COMPREHENSIVE METABOLIC PANEL
ALT: 13 U/L (ref 0–44)
AST: 17 U/L (ref 15–41)
Albumin: 2.6 g/dL — ABNORMAL LOW (ref 3.5–5.0)
Alkaline Phosphatase: 127 U/L — ABNORMAL HIGH (ref 38–126)
Anion gap: 12 (ref 5–15)
BUN: <5 mg/dL — ABNORMAL LOW (ref 6–20)
CO2: 21 mmol/L — ABNORMAL LOW (ref 22–32)
Calcium: 8.8 mg/dL — ABNORMAL LOW (ref 8.9–10.3)
Chloride: 106 mmol/L (ref 98–111)
Creatinine, Ser: 0.56 mg/dL (ref 0.44–1.00)
GFR calc Af Amer: >60 mL/min (ref >60)
GFR calc non Af Amer: >60 mL/min (ref >60)
Glucose, Bld: 163 mg/dL — ABNORMAL HIGH (ref 70–99)
Potassium: 3.5 mmol/L (ref 3.5–5.1)
Sodium: 139 mmol/L (ref 135–145)
Total Bilirubin: 0.6 mg/dL (ref 0.3–1.2)
Total Protein: 6 g/dL — ABNORMAL LOW (ref 6.5–8.1)

## 2018-12-21 LAB — CBC
HCT: 33.2 % — ABNORMAL LOW (ref 36.0–46.0)
Hemoglobin: 11.1 g/dL — ABNORMAL LOW (ref 12.0–15.0)
MCH: 28.5 pg (ref 26.0–34.0)
MCHC: 33.4 g/dL (ref 30.0–36.0)
MCV: 85.3 fL (ref 80.0–100.0)
Platelets: 168 10*3/uL (ref 150–400)
RBC: 3.89 MIL/uL (ref 3.87–5.11)
RDW: 15.6 % — ABNORMAL HIGH (ref 11.5–15.5)
WBC: 11 10*3/uL — ABNORMAL HIGH (ref 4.0–10.5)
nRBC: 0.2 % (ref 0.0–0.2)

## 2018-12-21 MED ORDER — SODIUM CHLORIDE 0.9% FLUSH: 3.0000 mL | INTRAVENOUS | Status: DC | PRN

## 2018-12-21 MED ORDER — NIFEDIPINE ER OSMOTIC RELEASE 30 MG PO TB24
30.0000 mg | ORAL_TABLET | Freq: Every day | ORAL | Status: DC
  Administered 2018-12-21 – 2018-12-22 (×2): 30 mg via ORAL
  Filled 2018-12-21 (×2): qty 1

## 2018-12-21 MED ORDER — PANTOPRAZOLE SODIUM 20 MG PO TBEC
20.0000 mg | DELAYED_RELEASE_TABLET | Freq: Every day | ORAL | Status: DC
  Administered 2018-12-21: 20 mg via ORAL
  Filled 2018-12-21: qty 1

## 2018-12-21 MED ORDER — SODIUM CHLORIDE 0.9% FLUSH
3.0000 mL | Freq: Two times a day (BID) | INTRAVENOUS | Status: DC
  Administered 2018-12-21 (×2): 3 mL via INTRAVENOUS

## 2018-12-21 NOTE — Progress Notes (Signed)
Linda Cole is a 33 y.o. G3P1011 at [redacted]w[redacted]d admitted for Elevated BP.  Subjective: Pt without complaints.  Denies headaches, visual changes, upper abdominal pain.  Occasional contractions.  Good fetal movement.  Objective: BP (!) 141/92   Pulse (!) 114   Temp 98.2 F (36.8 C) (Oral)   Resp 18   Ht 5' (1.524 m)   Wt 86.6 kg   LMP 04/22/2018 Comment: 7 weeks   SpO2 99%   BMI 37.30 kg/m  I/O last 3 completed shifts: In: 2966.6 [P.O.:2640; I.V.:26.6; IV Piggyback:300] Out: 3300 [Urine:3300] Total I/O In: 775 [P.O.:775] Out: 1350 [Urine:1350]  Gen:  NAD Abd:  No RUQ pain. Ext:  No calf tenderness, minimal edema  FHT:  FHR: 130s bpm, variability: moderate,  accelerations:  Present,  decelerations:  Absent UC:   occasional SVE:   Dilation: Fingertip Effacement (%): Thick Exam by:: Dr. Dion Body Cervix: deferred today.  Labs: Lab Results  Component Value Date   WBC 11.1 (H) 12/20/2018   HGB 10.9 (L) 12/20/2018   HCT 32.6 (L) 12/20/2018   MCV 85.3 12/20/2018   PLT 167 12/20/2018   CMP     Component Value Date/Time   NA 134 (L) 12/20/2018 1749   K 2.8 (L) 12/20/2018 1749   CL 103 12/20/2018 1749   CO2 23 12/20/2018 1749   GLUCOSE 112 (H) 12/20/2018 1749   BUN <5 (L) 12/20/2018 1749   CREATININE 0.56 12/20/2018 1749   CREATININE 0.74 06/04/2018 0917   CALCIUM 8.7 (L) 12/20/2018 1749   PROT 5.6 (L) 12/20/2018 1749   ALBUMIN 2.4 (L) 12/20/2018 1749   AST 17 12/20/2018 1749   ALT 12 12/20/2018 1749   ALKPHOS 115 12/20/2018 1749   BILITOT 0.7 12/20/2018 1749   GFRNONAA >60 12/20/2018 1749   GFRNONAA 109 06/04/2018 0917   GFRAA >60 12/20/2018 1749   GFRAA 126 06/04/2018 0917   PCR 12/18/18- 0.15          12/20/18-protein undetectable  Ultrasound 12/20/18 pending  Consulted Dr. Daisy Floro MFM by phone.  He reviewed ultrasound, labs, VS.  His concern is that she required Labatelol IV on admission and platelets may be low for her but still in the normal range.  Pt has  not required any more IV Labetalol but BP remains elevated.  He recommend 24 hour observation, repeat labs.  Agrees with delivery if pt has another severe range BP or labs are abnormal.  Platelets would need to be less than 100 to proceed with delivery.  Assessment / Plan: IUP @ 34 5/7 weeks  Preeclampsia:  See above.  Asymptomatic.  Protein now undetectable. Check CMP and CBC this am.   Hypokalemia:  Improved slightly s/p K-runs.  Continue po Kdur 40 meq q 6 hours. Fetal Wellbeing:  Category I I/D:  GBS negative   TED hose ordered.  [name] 12/21/2018, 10:12 AM

## 2018-12-21 NOTE — Progress Notes (Signed)
Labs reviewed remotely.  Stable.  BP mildly elevated. BP (!) 142/93   Pulse (!) 109   Temp 98.2 F (36.8 C) (Oral)   Resp 18   Ht 5' (1.524 m)   Wt 86.6 kg   LMP 04/22/2018 Comment: 7 weeks   SpO2 98%   BMI 37.30 kg/m  K+ repleted but low normal.  Discontinue Kdur after next dose.    CBC Latest Ref Rng & Units 12/21/2018 12/20/2018 12/19/2018  WBC 4.0 - 10.5 K/uL 11.0(H) 11.1(H) 10.4  Hemoglobin 12.0 - 15.0 g/dL 11.1(L) 10.9(L) 11.4(L)  Hematocrit 36.0 - 46.0 % 33.2(L) 32.6(L) 34.0(L)  Platelets 150 - 400 K/uL 168 167 171   CMP Latest Ref Rng & Units 12/21/2018 12/20/2018 12/20/2018  Glucose 70 - 99 mg/dL 299(M) 426(S) -  BUN 6 - 20 mg/dL <3(M) <1(D) -  Creatinine 0.44 - 1.00 mg/dL 6.22 2.97 9.89  Sodium 135 - 145 mmol/L 139 134(L) -  Potassium 3.5 - 5.1 mmol/L 3.5 2.8(L) -  Chloride 98 - 111 mmol/L 106 103 -  CO2 22 - 32 mmol/L 21(L) 23 -  Calcium 8.9 - 10.3 mg/dL 2.1(J) 9.4(R) -  Total Protein 6.5 - 8.1 g/dL 6.0(L) 5.6(L) -  Total Bilirubin 0.3 - 1.2 mg/dL 0.6 0.7 -  Alkaline Phos 38 - 126 U/L 127(H) 115 -  AST 15 - 41 U/L 17 17 -  ALT 0 - 44 U/L 13 12 -

## 2018-12-22 MED ORDER — NIFEDIPINE ER 30 MG PO TB24: 30.0000 mg | ORAL_TABLET | Freq: Every day | ORAL | 3 refills | Status: DC

## 2018-12-22 MED FILL — NIFEdipine ER 30 MG TB24: 30 | 30 days supply | Qty: 30 | Fill #0

## 2018-12-22 NOTE — Discharge Summary (Signed)
Physician Discharge Summary  Patient ID: Linda Cole MRN: [id number] DOB/AGE: 1986/06/22 32 y.o.  Admit date: 12/19/2018 Discharge date: 12/22/2018  Admission Diagnoses: Elevated blood pressure affecting 3rd trimester  Discharge Diagnoses: Gestational HTN Discharged Condition: stable  Hospital Course: 32yo G3P1011 @[redacted]w[redacted]d  admitted for elevated blood pressures.  Pt was monitored to rule out preeclampsia- lab work remained stable, no further symptoms developed and blood pressure was controlled with oral Procardia.  She was discharged home with plans for close outpatient follow up.  Consults: MFM  Significant Diagnostic Studies: labs:  Results for orders placed or performed during the hospital encounter of 12/19/18 (from the past 24 hour(s))  Comprehensive metabolic panel     Status: Abnormal   Collection Time: 12/21/18 10:46 AM  Result Value Ref Range   Sodium 139 135 - 145 mmol/L   Potassium 3.5 3.5 - 5.1 mmol/L   Chloride 106 98 - 111 mmol/L   CO2 21 (L) 22 - 32 mmol/L   Glucose, Bld 163 (H) 70 - 99 mg/dL   BUN <5 (L) 6 - 20 mg/dL   Creatinine, Ser 0.860.56 0.44 - 1.00 mg/dL   Calcium 8.8 (L) 8.9 - 10.3 mg/dL   Total Protein 6.0 (L) 6.5 - 8.1 g/dL   Albumin 2.6 (L) 3.5 - 5.0 g/dL   AST 17 15 - 41 U/L   ALT 13 0 - 44 U/L   Alkaline Phosphatase 127 (H) 38 - 126 U/L   Total Bilirubin 0.6 0.3 - 1.2 mg/dL   GFR calc non Af Amer >60 >60 mL/min   GFR calc Af Amer >60 >60 mL/min   Anion gap 12 5 - 15  CBC     Status: Abnormal   Collection Time: 12/21/18 10:46 AM  Result Value Ref Range   WBC 11.0 (H) 4.0 - 10.5 K/uL   RBC 3.89 3.87 - 5.11 MIL/uL   Hemoglobin 11.1 (L) 12.0 - 15.0 g/dL   HCT 57.833.2 (L) 46.936.0 - 62.946.0 %   MCV 85.3 80.0 - 100.0 fL   MCH 28.5 26.0 - 34.0 pg   MCHC 33.4 30.0 - 36.0 g/dL   RDW 52.815.6 (H) 41.311.5 - 24.415.5 %   Platelets 168 150 - 400 K/uL   nRBC 0.2 0.0 - 0.2 %     Treatments: steroids: betamethasone, IV Labetalol x1, Procardia XL 30mg  daily  Discharge  Exam: Blood pressure (!) 145/98, pulse (!) 112, temperature 97.9 F (36.6 C), temperature source Oral, resp. rate 17, height 5' (1.524 m), weight 86.7 kg, last menstrual period 04/22/2018, SpO2 100 %, unknown if currently breastfeeding. General appearance: alert, cooperative and no distress CV: RRR Lungs; CTAB Abd: Gravid, non-tender Ext: no edema, no calf tenderness bilaterally' Neuro: A & O x 3  Disposition: stable  Discharge Instructions    Discharge patient   Complete by:  As directed    Discharge disposition:  01-Home or Self Care   Discharge patient date:  12/22/2018     Allergies as of 12/22/2018      Reactions   Sulfa Antibiotics Other (See Comments)   Child hood allergy, unknown reaction   Sulfamethoxazole    Unknown; childhood reaction. Patient states she thinks it was a rash      Medication List    TAKE these medications   multivitamin-prenatal 27-0.8 MG Tabs tablet Take 1 tablet by mouth daily at 12 noon.   NIFEdipine 30 MG 24 hr tablet Commonly known as:  ADALAT CC Take 1 tablet (30 mg total) by mouth  daily.      Follow-up Information    [name], MD Follow up in 2 day(s).   Specialty:  Obstetrics and Gynecology Contact information: [address] [place] [address] [phone]           Signed: Sharon Seller 12/22/2018, 7:04 AM

## 2018-12-22 NOTE — Progress Notes (Signed)
Late entry Called MFM, Dr. Daisy Floro, to discuss elevated BP. Desire to give pt po antihypertensive vs. Move toward delivery. He clarified that previously he did not recommend any more IV antihypertensives but po was acceptable. Ok to give Procardia XL 30 mg in an effort to get pt to 37 weeks.   If pt requires max doses, that will be telling.  I feel pt has Gest HTN based on labs (PCR now undetectable) and clinical presentation.  Pt has only required 1 dose of IV Labetalol 20 mg, which was on admission.   Also, potassium came up to 3.5.  Discontinued Kdur 40 meq after next dose, which would be 5 doses total.

## 2018-12-22 NOTE — Plan of Care (Signed)
Pt's BP range for this shift 145-151/85-98. Pt denies headache, blurred vision, and epigastric pain. Pt reports good fetal movement. Pt denies bleeding, leaking and contractions. Pt had reactive NST.   Problem: Education: Goal: Knowledge of the prescribed therapeutic regimen will improve Outcome: Progressing Goal: Individualized Educational Video(s) Outcome: Progressing   Problem: Clinical Measurements: Goal: Complications related to the disease process, condition or treatment will be avoided or minimized Outcome: Progressing   Problem: Education: Goal: Knowledge of General Education information will improve Description Including pain rating scale, medication(s)/side effects and non-pharmacologic comfort measures Outcome: Progressing   Problem: Health Behavior/Discharge Planning: Goal: Ability to manage health-related needs will improve Outcome: Progressing   Problem: Clinical Measurements: Goal: Ability to maintain clinical measurements within normal limits will improve Outcome: Progressing Goal: Will remain free from infection Outcome: Progressing Goal: Diagnostic test results will improve Outcome: Progressing Goal: Respiratory complications will improve Outcome: Progressing Goal: Cardiovascular complication will be avoided Outcome: Progressing   Problem: Activity: Goal: Risk for activity intolerance will decrease Outcome: Progressing   Problem: Nutrition: Goal: Adequate nutrition will be maintained Outcome: Progressing   Problem: Coping: Goal: Level of anxiety will decrease Outcome: Progressing   Problem: Elimination: Goal: Will not experience complications related to bowel motility Outcome: Progressing Goal: Will not experience complications related to urinary retention Outcome: Progressing   Problem: Pain Managment: Goal: General experience of comfort will improve Outcome: Progressing   Problem: Safety: Goal: Ability to remain free from injury will  improve Outcome: Progressing   Problem: Skin Integrity: Goal: Risk for impaired skin integrity will decrease Outcome: Progressing

## 2018-12-22 NOTE — Discharge Instructions (Signed)
Hypertension During Pregnancy ° °Hypertension, commonly called high blood pressure, is when the force of blood pumping through your arteries is too strong. Arteries are blood vessels that carry blood from the heart throughout the body. Hypertension during pregnancy can cause problems for you and your baby. Your baby may be born early (prematurely) or may not weigh as much as he or she should at birth. Very bad cases of hypertension during pregnancy can be life-threatening. °Different types of hypertension can occur during pregnancy. These include: °· Chronic hypertension. This happens when: °? You have hypertension before pregnancy and it continues during pregnancy. °? You develop hypertension before you are [redacted] weeks pregnant, and it continues during pregnancy. °· Gestational hypertension. This is hypertension that develops after the 20th week of pregnancy. °· Preeclampsia, also called toxemia of pregnancy. This is a very serious type of hypertension that develops during pregnancy. It can be very dangerous for you and your baby. °? In rare cases, you may develop preeclampsia after giving birth (postpartum preeclampsia). This usually occurs within 48 hours after childbirth but may occur up to 6 weeks after giving birth. °Gestational hypertension and preeclampsia usually go away within 6 weeks after your baby is born. Women who have hypertension during pregnancy have a greater chance of developing hypertension later in life or during future pregnancies. °What are the causes? °The exact cause of hypertension during pregnancy is not known. °What increases the risk? °There are certain factors that make it more likely for you to develop hypertension during pregnancy. These include: °· Having hypertension during a previous pregnancy or prior to pregnancy. °· Being overweight. °· Being age 35 or older. °· Being pregnant for the first time. °· Being pregnant with more than one baby. °· Becoming pregnant using fertilization  methods such as IVF (in vitro fertilization). °· Having diabetes, kidney problems, or systemic lupus erythematosus. °· Having a family history of hypertension. °What are the signs or symptoms? °Chronic hypertension and gestational hypertension rarely cause symptoms. Preeclampsia causes symptoms, which may include: °· Increased protein in your urine. Your health care provider will check for this at every visit before you give birth (prenatal visit). °· Severe headaches. °· Sudden weight gain. °· Swelling of the hands, face, legs, and feet. °· Nausea and vomiting. °· Vision problems, such as blurred or double vision. °· Numbness in the face, arms, legs, and feet. °· Dizziness. °· Slurred speech. °· Sensitivity to bright lights. °· Abdominal pain. °· Convulsions or seizures. °How is this diagnosed? °You may be diagnosed with hypertension during a routine prenatal exam. At each prenatal visit, you may: °· Have a urine test to check for high amounts of protein in your urine. °· Have your blood pressure checked. A blood pressure reading is given as two numbers, such as "120 over 80" (or 120/80). The first ("top") number is a measure of the pressure in your arteries when your heart beats (systolic pressure). The second ("bottom") number is a measure of the pressure in your arteries as your heart relaxes between beats (diastolic pressure). Blood pressure is measured in a unit called mm Hg. For most women, a normal blood pressure reading is: °? Systolic: below 120. °? Diastolic: below 80. °The type of hypertension that you are diagnosed with depends on your test results and when your symptoms developed. °· Chronic hypertension is usually diagnosed before 20 weeks of pregnancy. °· Gestational hypertension is usually diagnosed after 20 weeks of pregnancy. °· Hypertension with high amounts of protein in   the urine is diagnosed as preeclampsia. °· Blood pressure measurements that stay above 160 systolic, or above 110 diastolic,  are signs of severe preeclampsia. °How is this treated? °Treatment for hypertension during pregnancy varies depending on the type of hypertension you have and how serious it is. °· If you take medicines called ACE inhibitors to treat chronic hypertension, you may need to switch medicines. ACE inhibitors should not be taken during pregnancy. °· If you have gestational hypertension, you may need to take blood pressure medicine. °· If you are at risk for preeclampsia, your health care provider may recommend that you take a low-dose aspirin during your pregnancy. °· If you have severe preeclampsia, you may need to be hospitalized so you and your baby can be monitored closely. You may also need to take medicine (magnesium sulfate) to prevent seizures and to lower blood pressure. This medicine may be given as an injection or through an IV. °· In some cases, if your condition gets worse, you may need to deliver your baby early. °Follow these instructions at home: °Eating and drinking ° °· Drink enough fluid to keep your urine pale yellow. °· Avoid caffeine. °Lifestyle °· Do not use any products that contain nicotine or tobacco, such as cigarettes and e-cigarettes. If you need help quitting, ask your health care provider. °· Do not use alcohol or drugs. °· Avoid stress as much as possible. Rest and get plenty of sleep. °General instructions °· Take over-the-counter and prescription medicines only as told by your health care provider. °· While lying down, lie on your left side. This keeps pressure off your major blood vessels. °· While sitting or lying down, raise (elevate) your feet. Try putting some pillows under your lower legs. °· Exercise regularly. Ask your health care provider what kinds of exercise are best for you. °· Keep all prenatal and follow-up visits as told by your health care provider. This is important. °Contact a health care provider if: °· You have symptoms that your health care provider told you may  require more treatment or monitoring, such as: °? Nausea or vomiting. °? Headache. °Get help right away if you have: °· Severe abdominal pain that does not get better with treatment. °· A severe headache that does not get better. °· Vomiting that does not get better. °· Sudden, rapid weight gain. °· Sudden swelling in your hands, ankles, or face. °· Vaginal bleeding. °· Blood in your urine. °· Fewer movements from your baby than usual. °· Blurred or double vision. °· Muscle twitching or sudden muscle tightening (spasms). °· Shortness of breath. °· Blue fingernails or lips. °Summary °· Hypertension, commonly called high blood pressure, is when the force of blood pumping through your arteries is too strong. °· Hypertension during pregnancy can cause problems for you and your baby. °· Treatment for hypertension during pregnancy varies depending on the type of hypertension you have and how serious it is. °· Get help right away if you have symptoms that your health care provider told you to watch for. °This information is not intended to replace advice given to you by your health care provider. Make sure you discuss any questions you have with your health care provider. °Document Released: 04/02/2011 Document Revised: 07/01/2017 Document Reviewed: 12/29/2015 °Elsevier Interactive Patient Education © 2019 [employer] ° °

## 2018-12-24 ENCOUNTER — Inpatient Hospital Stay (HOSPITAL_COMMUNITY)
Admission: AD | Admit: 2018-12-24 | Discharge: 2018-12-27 | DRG: 807 | Disposition: A | Discharge status: Home / Self Care | Payer: No Typology Code available for payment source | Attending: Obstetrics and Gynecology | Admitting: Obstetrics and Gynecology

## 2018-12-24 ENCOUNTER — Other Ambulatory Visit: Payer: Self-pay | Admitting: Obstetrics and Gynecology

## 2018-12-24 ENCOUNTER — Encounter (HOSPITAL_COMMUNITY): Payer: Self-pay | Admitting: General Practice

## 2018-12-24 ENCOUNTER — Other Ambulatory Visit: Payer: Self-pay

## 2018-12-24 DIAGNOSIS — O1414 Severe pre-eclampsia complicating childbirth: Secondary | ICD-10-CM | POA: Diagnosis present

## 2018-12-24 DIAGNOSIS — D573 Sickle-cell trait: Secondary | ICD-10-CM | POA: Diagnosis present

## 2018-12-24 DIAGNOSIS — O9902 Anemia complicating childbirth: Secondary | ICD-10-CM | POA: Diagnosis present

## 2018-12-24 DIAGNOSIS — O99284 Endocrine, nutritional and metabolic diseases complicating childbirth: Secondary | ICD-10-CM | POA: Diagnosis present

## 2018-12-24 DIAGNOSIS — Z3A35 35 weeks gestation of pregnancy: Secondary | ICD-10-CM | POA: Diagnosis not present

## 2018-12-24 DIAGNOSIS — O1413 Severe pre-eclampsia, third trimester: Secondary | ICD-10-CM | POA: Diagnosis present

## 2018-12-24 DIAGNOSIS — E876 Hypokalemia: Secondary | ICD-10-CM | POA: Diagnosis present

## 2018-12-24 LAB — TYPE AND SCREEN
ABO/RH(D): O POS
Antibody Screen: NEGATIVE

## 2018-12-24 LAB — CBC
HCT: 39.5 % (ref 36.0–46.0)
Hemoglobin: 13.4 g/dL (ref 12.0–15.0)
MCH: 28.6 pg (ref 26.0–34.0)
MCHC: 33.9 g/dL (ref 30.0–36.0)
MCV: 84.4 fL (ref 80.0–100.0)
Platelets: 218 10*3/uL (ref 150–400)
RBC: 4.68 MIL/uL (ref 3.87–5.11)
RDW: 15.5 % (ref 11.5–15.5)
WBC: 13.6 10*3/uL — ABNORMAL HIGH (ref 4.0–10.5)
nRBC: 0.1 % (ref 0.0–0.2)

## 2018-12-24 LAB — COMPREHENSIVE METABOLIC PANEL
ALT: 16 U/L (ref 0–44)
AST: 23 U/L (ref 15–41)
Albumin: 3 g/dL — ABNORMAL LOW (ref 3.5–5.0)
Alkaline Phosphatase: 149 U/L — ABNORMAL HIGH (ref 38–126)
Anion gap: 11 (ref 5–15)
BUN: <5 mg/dL — ABNORMAL LOW (ref 6–20)
CO2: 24 mmol/L (ref 22–32)
Calcium: 9.9 mg/dL (ref 8.9–10.3)
Chloride: 102 mmol/L (ref 98–111)
Creatinine, Ser: 0.45 mg/dL (ref 0.44–1.00)
GFR calc Af Amer: >60 mL/min (ref >60)
GFR calc non Af Amer: >60 mL/min (ref >60)
Glucose, Bld: 87 mg/dL (ref 70–99)
Potassium: 3.1 mmol/L — ABNORMAL LOW (ref 3.5–5.1)
Sodium: 137 mmol/L (ref 135–145)
Total Bilirubin: 1 mg/dL (ref 0.3–1.2)
Total Protein: 6.9 g/dL (ref 6.5–8.1)

## 2018-12-24 MED ORDER — PHENYLEPHRINE 40 MCG/ML (10ML) SYRINGE FOR IV PUSH (FOR BLOOD PRESSURE SUPPORT)
80.0000 ug | PREFILLED_SYRINGE | INTRAVENOUS | Status: DC | PRN
  Filled 2018-12-24: qty 10

## 2018-12-24 MED ORDER — SOD CITRATE-CITRIC ACID 500-334 MG/5ML PO SOLN: 30.0000 mL | ORAL | Status: DC | PRN

## 2018-12-24 MED ORDER — OXYTOCIN 40 UNITS IN NORMAL SALINE INFUSION - SIMPLE MED
1.0000 m[IU]/min | INTRAVENOUS | Status: DC
  Administered 2018-12-24: 2 m[IU]/min via INTRAVENOUS
  Filled 2018-12-24: qty 1000

## 2018-12-24 MED ORDER — TERBUTALINE SULFATE 1 MG/ML IJ SOLN: 0.2500 mg | Freq: Once | INTRAMUSCULAR | Status: DC | PRN

## 2018-12-24 MED ORDER — LABETALOL HCL 5 MG/ML IV SOLN
20.0000 mg | INTRAVENOUS | Status: DC | PRN
  Administered 2018-12-24: 20 mg via INTRAVENOUS
  Filled 2018-12-24 (×2): qty 4

## 2018-12-24 MED ORDER — EPHEDRINE 5 MG/ML INJ: 10.0000 mg | INTRAVENOUS | Status: DC | PRN

## 2018-12-24 MED ORDER — LACTATED RINGERS IV SOLN: 500.0000 mL | Freq: Once | INTRAVENOUS | Status: DC

## 2018-12-24 MED ORDER — POTASSIUM CHLORIDE CRYS ER 20 MEQ PO TBCR
40.0000 meq | EXTENDED_RELEASE_TABLET | Freq: Two times a day (BID) | ORAL | Status: DC
  Administered 2018-12-24: 40 meq via ORAL
  Filled 2018-12-24 (×2): qty 2

## 2018-12-24 MED ORDER — LABETALOL HCL 5 MG/ML IV SOLN: 40.0000 mg | INTRAVENOUS | Status: DC | PRN

## 2018-12-24 MED ORDER — LABETALOL HCL 5 MG/ML IV SOLN: 80.0000 mg | INTRAVENOUS | Status: DC | PRN

## 2018-12-24 MED ORDER — HYDRALAZINE HCL 20 MG/ML IJ SOLN: 10.0000 mg | INTRAMUSCULAR | Status: DC | PRN

## 2018-12-24 MED ORDER — OXYTOCIN BOLUS FROM INFUSION
500.0000 mL | Freq: Once | INTRAVENOUS | Status: AC
  Administered 2018-12-25: 10:00:00 500 mL via INTRAVENOUS

## 2018-12-24 MED ORDER — PHENYLEPHRINE 40 MCG/ML (10ML) SYRINGE FOR IV PUSH (FOR BLOOD PRESSURE SUPPORT): 80.0000 ug | PREFILLED_SYRINGE | INTRAVENOUS | Status: DC | PRN

## 2018-12-24 MED ORDER — LACTATED RINGERS IV SOLN
INTRAVENOUS | Status: DC
  Administered 2018-12-24 – 2018-12-25 (×4): via INTRAVENOUS

## 2018-12-24 MED ORDER — MAGNESIUM SULFATE BOLUS VIA INFUSION
4.0000 g | Freq: Once | INTRAVENOUS | Status: AC
  Administered 2018-12-25: 4 g via INTRAVENOUS
  Filled 2018-12-24: qty 500

## 2018-12-24 MED ORDER — ACETAMINOPHEN 325 MG PO TABS: 650.0000 mg | ORAL_TABLET | ORAL | Status: DC | PRN

## 2018-12-24 MED ORDER — LIDOCAINE HCL (PF) 1 % IJ SOLN: 30.0000 mL | INTRAMUSCULAR | Status: DC | PRN

## 2018-12-24 MED ORDER — HYDROXYZINE HCL 50 MG PO TABS: 50.0000 mg | ORAL_TABLET | Freq: Four times a day (QID) | ORAL | Status: DC | PRN

## 2018-12-24 MED ORDER — MAGNESIUM SULFATE 40 G IN LACTATED RINGERS - SIMPLE
2.0000 g/h | INTRAVENOUS | Status: DC
  Administered 2018-12-25: 2 g/h via INTRAVENOUS
  Filled 2018-12-24: qty 500

## 2018-12-24 MED ORDER — MISOPROSTOL 25 MCG QUARTER TABLET
25.0000 ug | ORAL_TABLET | ORAL | Status: DC | PRN
  Administered 2018-12-24 (×2): 25 ug via VAGINAL
  Filled 2018-12-24 (×2): qty 1

## 2018-12-24 MED ORDER — LABETALOL HCL 5 MG/ML IV SOLN
20.0000 mg | INTRAVENOUS | Status: DC | PRN
  Administered 2018-12-25: 20 mg via INTRAVENOUS

## 2018-12-24 MED ORDER — OXYCODONE-ACETAMINOPHEN 5-325 MG PO TABS: 1.0000 | ORAL_TABLET | ORAL | Status: DC | PRN

## 2018-12-24 MED ORDER — ZOLPIDEM TARTRATE 5 MG PO TABS: 5.0000 mg | ORAL_TABLET | Freq: Every evening | ORAL | Status: DC | PRN

## 2018-12-24 MED ORDER — FENTANYL-BUPIVACAINE-NACL 0.5-0.125-0.9 MG/250ML-% EP SOLN
12.0000 mL/h | EPIDURAL | Status: DC | PRN
  Filled 2018-12-24: qty 250

## 2018-12-24 MED ORDER — OXYCODONE-ACETAMINOPHEN 5-325 MG PO TABS: 2.0000 | ORAL_TABLET | ORAL | Status: DC | PRN

## 2018-12-24 MED ORDER — BUTORPHANOL TARTRATE 1 MG/ML IJ SOLN: 1.0000 mg | INTRAMUSCULAR | Status: DC | PRN

## 2018-12-24 MED ORDER — OXYTOCIN 40 UNITS IN NORMAL SALINE INFUSION - SIMPLE MED: 2.5000 [IU]/h | INTRAVENOUS | Status: DC

## 2018-12-24 MED ORDER — LACTATED RINGERS IV SOLN
500.0000 mL | INTRAVENOUS | Status: DC | PRN
  Administered 2018-12-25: 500 mL via INTRAVENOUS

## 2018-12-24 MED ORDER — DIPHENHYDRAMINE HCL 50 MG/ML IJ SOLN: 12.5000 mg | INTRAMUSCULAR | Status: DC | PRN

## 2018-12-24 MED ORDER — ONDANSETRON HCL 4 MG/2ML IJ SOLN: 4.0000 mg | Freq: Four times a day (QID) | INTRAMUSCULAR | Status: DC | PRN

## 2018-12-24 NOTE — Progress Notes (Signed)
Linda Cole is a 33 y.o. G3P1011 at [redacted]w[redacted]d  Subjective: Pt feels contractions, 7/10.  Denies headaches, visual changes or upper abdominal pain.  Objective: BP (!) 152/103   Pulse 92   Temp 99.8 F (37.7 C) (Oral)   Resp 18   Ht 5' (1.524 m)   Wt 83 kg   LMP 04/22/2018 Comment: 7 weeks   BMI 35.74 kg/m  I/O last 3 completed shifts: In: 492.5 [I.V.:492.5] Out: -  No intake/output data recorded.  FHT:  FHR: 150s bpm, variability: moderate,  accelerations:  Present,  decelerations:  Absent UC:   regular, every 2 minutes SVE:   Dilation: 2 Effacement (%): 50 Station: -1 Exam by:: Dr. Dion Body   Labs: Lab Results  Component Value Date   WBC 13.6 (H) 12/24/2018   HGB 13.4 12/24/2018   HCT 39.5 12/24/2018   MCV 84.4 12/24/2018   PLT 218 12/24/2018    Assessment / Plan: IUP @ 35 1/7 weeks Induction due to Preeclampsia with severe features.  Labor: S/p Cytotec x 2.  Tachysystole at times.  Start Pitocin when next dose of Cytotec would be due. Preeclampsia:  labs stable and BP elevating.  Start Magnesium if severe range or symptoms develop. Fetal Wellbeing:  Category I Pain Control:  Pt declines at this time. I/D:  GBS negative Anticipated MOD:  NSVD  [name] 12/24/2018, 10:05 PM

## 2018-12-24 NOTE — H&P (Signed)
Linda Cole is a 33 y.o. female G3 P1011 @ 35 1/7 weeks admitted for IOL due to Preeclampsia with severe features.  Pt was admitted at 34 4/7 weeks due to Hypertension.  Pt took her BP at home with a wrist cuff, b/c her hands were swelling,which read at 160s/110s.   Pt denied headaches, visual changes or abdominal pain at that time.  On arrival to [facility], BPs were in the severe range.  The highest BP 159/111.  Pt received Labetalol 20 mg IV once and BPs normalized or were mildly elevated.  Pt was observed inpatient x 48 hours and did not require anymore IV antihypertensives but her BP remained elevated.  She was started on Procardia XL 30 mg which maintained BP in the mild range.  She received a full course of BMZ, GBS by PCR done which was negative.  Growth ultrasound was normal.    Pt seen today in office for hospital follow up.  She reports that in the morning before she takes her Procardia, her BP is very high.  Pt has purchased a digital monitor with a BP cuff.  The range of BP at home has been 134-162/85/116.This am it was 162/116.  Last night at ~ 6 pm 155/107, 10:25 am yesterday 153/110.  Pt also reported she had a headache yesterday with a nose bleed, which she rarely has.  She had a mild headache at the office this am.  Denied visual changes, upper abdominal pain.  Pt denies headache now.  PNC has been uncomplicated.  Pt had one episode of high normal BP the early 3rd trimester.  Due to her h/o Gestational HTN in a previous pregnancy, baseline preeclampsia labs were done at that time, which were normal.  BPs subsequently, remained in the 110s/70s.   OB History    Gravida  3   Para  1   Term  1   Preterm      AB  1   Living  1     SAB  1   TAB      Ectopic      Multiple      Live Births  1          Past Medical History:  Diagnosis Date  . Anemia   . Hypokalemia   . Pregnancy induced hypertension   . Sickle cell trait (HCC)   . Vaginal Pap smear, abnormal    Past  Surgical History:  Procedure Laterality Date  . NO PAST SURGERIES     Family History: family history includes COPD in her maternal grandmother, paternal grandfather, and paternal grandmother; Diabetes in her maternal grandfather, paternal grandfather, and paternal grandmother; Eczema in her daughter; Hyperlipidemia in her mother; Hypertension in her maternal grandmother, paternal grandfather, and paternal grandmother; Kidney disease in her father. Social History:  reports that she has never smoked. She has never used smokeless tobacco. She reports previous alcohol use. She reports that she does not use drugs.     Maternal Diabetes: No Genetic Screening: Normal Maternal Ultrasounds/Referrals: Normal Fetal Ultrasounds or other Referrals:  None Maternal Substance Abuse:  No Significant Maternal Medications:  None Significant Maternal Lab Results:  None Other Comments:  Hypertension  Review of Systems  Eyes:       No visual changes.  Respiratory: Negative for shortness of breath.   Gastrointestinal: Positive for abdominal pain.       Discomfort with fetal movement  Neurological: Negative for headaches.   Maternal Medical History:  Contractions: Perceived severity is moderate.    Fetal activity: Perceived fetal activity is normal.    Prenatal complications: PIH and pre-eclampsia.   Prenatal Complications - Diabetes: none.    Dilation: 1 Effacement (%): Thick Station: -3 Exam by:: Johnathan HausenAshley Gray RN  Blood pressure 130/77, pulse 100, temperature 99 F (37.2 C), temperature source Oral, resp. rate 16, height 5' (1.524 m), weight 83 kg, last menstrual period 04/22/2018, unknown if currently breastfeeding. Maternal Exam:  Uterine Assessment: Contraction strength is moderate.  Contraction frequency is regular.   Abdomen: Patient reports no abdominal tenderness. Estimated fetal weight is 5 lb 2 oz - 12/20/18.   Fetal presentation: vertex  Introitus: not evaluated.   Pelvis: adequate  for delivery.   Cervix: not evaluated.   Fetal Exam Fetal Monitor Review: Mode: fetoscope.   Baseline rate: 130s.  Variability: moderate (6-25 bpm).   Pattern: accelerations present and no decelerations.    Fetal State Assessment: Category I - tracings are normal.     Physical Exam  Constitutional: She is oriented to person, place, and time. She appears well-developed and well-nourished.  HENT:  Head: Normocephalic and atraumatic.  Eyes: EOM are normal.  Neck: Normal range of motion.  Respiratory: Effort normal. No respiratory distress.  GI: There is no abdominal tenderness.  At palpation of fundus  Musculoskeletal: Normal range of motion.        General: Edema present.  Neurological: She is alert and oriented to person, place, and time. She has normal reflexes.  DTRs previously not illicited, now 2+.  Skin: Skin is warm and dry.  Psychiatric: She has a normal mood and affect.    CBC    Component Value Date/Time   WBC 13.6 (H) 12/24/2018 1430   RBC 4.68 12/24/2018 1430   HGB 13.4 12/24/2018 1430   HCT 39.5 12/24/2018 1430   PLT 218 12/24/2018 1430   MCV 84.4 12/24/2018 1430   MCH 28.6 12/24/2018 1430   MCHC 33.9 12/24/2018 1430   RDW 15.5 12/24/2018 1430   LYMPHSABS 3.3 06/09/2018 1903   MONOABS 0.7 06/09/2018 1903   EOSABS 0.2 06/09/2018 1903   BASOSABS 0.1 06/09/2018 1903   CMP     Component Value Date/Time   NA 137 12/24/2018 1430   K 3.1 (L) 12/24/2018 1430   CL 102 12/24/2018 1430   CO2 24 12/24/2018 1430   GLUCOSE 87 12/24/2018 1430   BUN <5 (L) 12/24/2018 1430   CREATININE 0.45 12/24/2018 1430   CREATININE 0.74 06/04/2018 0917   CALCIUM 9.9 12/24/2018 1430   PROT 6.9 12/24/2018 1430   ALBUMIN 3.0 (L) 12/24/2018 1430   AST 23 12/24/2018 1430   ALT 16 12/24/2018 1430   ALKPHOS 149 (H) 12/24/2018 1430   BILITOT 1.0 12/24/2018 1430   GFRNONAA >60 12/24/2018 1430   GFRNONAA 109 06/04/2018 0917   GFRAA >60 12/24/2018 1430   GFRAA 126 06/04/2018  0917    Prenatal labs: ABO, Rh: --/--/O POS (05/28 1430) Antibody: NEG (05/28 1430) Rubella: Immune (11/19 0000) RPR: Nonreactive (11/19 0000)  HBsAg: Negative (11/19 0000)  HIV: Non-reactive (12/16 0000)  GBS:   Neg  Assessment/Plan: IUP @ 35 1/7 weeks  Hypertension:  Preeclampsia with severe features (BP)  Labs normal and stable.  Severe range BP on Procardia and headache.  IOL with Cytotec.  Suspect BP will increase once Procardia wears off. Start Magnesium if severe range BPs or symptoms.   Hypokalemia  S/p po potassium on previous admission.  Decreased slightly.  Will replace. Fetal wellbeing  S/p BMZ less than 1 week ago.  Category I tracing.     [name] 12/24/2018, 6:35 PM

## 2018-12-25 ENCOUNTER — Encounter (HOSPITAL_COMMUNITY): Payer: Self-pay | Admitting: Anesthesiology

## 2018-12-25 ENCOUNTER — Inpatient Hospital Stay (HOSPITAL_COMMUNITY): Payer: No Typology Code available for payment source | Admitting: Anesthesiology

## 2018-12-25 LAB — CBC
HCT: 36.1 % (ref 36.0–46.0)
HCT: 37.9 % (ref 36.0–46.0)
Hemoglobin: 12.3 g/dL (ref 12.0–15.0)
Hemoglobin: 12.7 g/dL (ref 12.0–15.0)
MCH: 29.1 pg (ref 26.0–34.0)
MCH: 29 pg (ref 26.0–34.0)
MCHC: 34.1 g/dL (ref 30.0–36.0)
MCHC: 33.5 g/dL (ref 30.0–36.0)
MCV: 85.5 fL (ref 80.0–100.0)
MCV: 86.5 fL (ref 80.0–100.0)
Platelets: 188 10*3/uL (ref 150–400)
Platelets: 176 10*3/uL (ref 150–400)
RBC: 4.22 MIL/uL (ref 3.87–5.11)
RBC: 4.38 MIL/uL (ref 3.87–5.11)
RDW: 15.5 % (ref 11.5–15.5)
RDW: 15.5 % (ref 11.5–15.5)
WBC: 14.4 10*3/uL — ABNORMAL HIGH (ref 4.0–10.5)
WBC: 14.5 10*3/uL — ABNORMAL HIGH (ref 4.0–10.5)
nRBC: 0 % (ref 0.0–0.2)
nRBC: 0 % (ref 0.0–0.2)

## 2018-12-25 LAB — RPR: RPR Ser Ql: NONREACTIVE

## 2018-12-25 MED ORDER — LABETALOL HCL 200 MG PO TABS
200.0000 mg | ORAL_TABLET | Freq: Two times a day (BID) | ORAL | Status: DC
  Administered 2018-12-26 (×2): 200 mg via ORAL
  Filled 2018-12-25 (×2): qty 1

## 2018-12-25 MED ORDER — LIDOCAINE HCL (PF) 1 % IJ SOLN
INTRAMUSCULAR | Status: DC | PRN
  Administered 2018-12-25 (×2): 4 mL via EPIDURAL

## 2018-12-25 MED ORDER — LACTATED RINGERS IV SOLN
INTRAVENOUS | Status: DC
  Administered 2018-12-25: 14:00:00 via INTRAVENOUS

## 2018-12-25 MED ORDER — ONDANSETRON HCL 4 MG/2ML IJ SOLN
4.0000 mg | INTRAMUSCULAR | Status: DC | PRN
  Administered 2018-12-25: 4 mg via INTRAVENOUS
  Filled 2018-12-25: qty 2

## 2018-12-25 MED ORDER — WITCH HAZEL-GLYCERIN EX PADS: 1.0000 "application " | MEDICATED_PAD | CUTANEOUS | Status: DC | PRN

## 2018-12-25 MED ORDER — DIBUCAINE (PERIANAL) 1 % EX OINT: 1.0000 "application " | TOPICAL_OINTMENT | CUTANEOUS | Status: DC | PRN

## 2018-12-25 MED ORDER — SODIUM CHLORIDE (PF) 0.9 % IJ SOLN
INTRAMUSCULAR | Status: DC | PRN
  Administered 2018-12-25: 10.5 mL/h via EPIDURAL

## 2018-12-25 MED ORDER — BENZOCAINE-MENTHOL 20-0.5 % EX AERO
1.0000 "application " | INHALATION_SPRAY | CUTANEOUS | Status: DC | PRN
  Filled 2018-12-25: qty 56

## 2018-12-25 MED ORDER — LACTATED RINGERS IV SOLN: 500.0000 mL | Freq: Once | INTRAVENOUS | Status: DC

## 2018-12-25 MED ORDER — ACETAMINOPHEN 325 MG PO TABS: 650.0000 mg | ORAL_TABLET | ORAL | Status: DC | PRN

## 2018-12-25 MED ORDER — OXYCODONE HCL 5 MG PO TABS: 5.0000 mg | ORAL_TABLET | ORAL | Status: DC | PRN

## 2018-12-25 MED ORDER — PRENATAL MULTIVITAMIN CH
1.0000 | ORAL_TABLET | Freq: Every day | ORAL | Status: DC
  Administered 2018-12-25 – 2018-12-27 (×3): 1 via ORAL
  Filled 2018-12-25 (×3): qty 1

## 2018-12-25 MED ORDER — SENNOSIDES-DOCUSATE SODIUM 8.6-50 MG PO TABS
2.0000 | ORAL_TABLET | ORAL | Status: DC
  Administered 2018-12-26 (×2): 2 via ORAL
  Filled 2018-12-25 (×2): qty 2

## 2018-12-25 MED ORDER — DIPHENHYDRAMINE HCL 25 MG PO CAPS: 25.0000 mg | ORAL_CAPSULE | Freq: Four times a day (QID) | ORAL | Status: DC | PRN

## 2018-12-25 MED ORDER — MAGNESIUM HYDROXIDE 400 MG/5ML PO SUSP: 30.0000 mL | ORAL | Status: DC | PRN

## 2018-12-25 MED ORDER — POTASSIUM CHLORIDE CRYS ER 20 MEQ PO TBCR
40.0000 meq | EXTENDED_RELEASE_TABLET | Freq: Two times a day (BID) | ORAL | Status: AC
  Administered 2018-12-25 (×2): 40 meq via ORAL
  Filled 2018-12-25 (×4): qty 2

## 2018-12-25 MED ORDER — SIMETHICONE 80 MG PO CHEW: 80.0000 mg | CHEWABLE_TABLET | ORAL | Status: DC | PRN

## 2018-12-25 MED ORDER — TETANUS-DIPHTH-ACELL PERTUSSIS 5-2.5-18.5 LF-MCG/0.5 IM SUSP: 0.5000 mL | Freq: Once | INTRAMUSCULAR | Status: DC

## 2018-12-25 MED ORDER — COCONUT OIL OIL: 1.0000 "application " | TOPICAL_OIL | Status: DC | PRN

## 2018-12-25 MED ORDER — ONDANSETRON HCL 4 MG PO TABS: 4.0000 mg | ORAL_TABLET | ORAL | Status: DC | PRN

## 2018-12-25 MED ORDER — MAGNESIUM SULFATE 40 G IN LACTATED RINGERS - SIMPLE
2.0000 g/h | INTRAVENOUS | Status: AC
  Administered 2018-12-25 (×2): 2 g/h via INTRAVENOUS
  Filled 2018-12-25: qty 500

## 2018-12-25 MED ORDER — MISOPROSTOL 200 MCG PO TABS: 800.0000 ug | ORAL_TABLET | Freq: Once | ORAL | Status: DC | PRN

## 2018-12-25 MED ORDER — ZOLPIDEM TARTRATE 5 MG PO TABS: 5.0000 mg | ORAL_TABLET | Freq: Every evening | ORAL | Status: DC | PRN

## 2018-12-25 MED ORDER — OXYCODONE HCL 5 MG PO TABS: 10.0000 mg | ORAL_TABLET | ORAL | Status: DC | PRN

## 2018-12-25 MED ORDER — IBUPROFEN 600 MG PO TABS
600.0000 mg | ORAL_TABLET | Freq: Four times a day (QID) | ORAL | Status: DC
  Administered 2018-12-25 – 2018-12-27 (×9): 600 mg via ORAL
  Filled 2018-12-25 (×9): qty 1

## 2018-12-25 NOTE — Progress Notes (Signed)
Linda Cole is a 33 y.o. G3P1011 at [redacted]w[redacted]d  Subjective: Pt comfortable s/p epidural.  Denies headaches, visual changes, upper abdominal pain.  Objective: BP (!) 141/100   Pulse 89   Temp 98 F (36.7 C) (Oral)   Resp 18   Ht 5' (1.524 m)   Wt 83 kg   LMP 04/22/2018 Comment: 7 weeks   SpO2 100%   BMI 35.74 kg/m  I/O last 3 completed shifts: In: 2112.4 [P.O.:90; I.V.:2022.4] Out: 1250 [Urine:1250] Total I/O In: 193.5 [I.V.:193.5] Out: 650 [Urine:650]  FHT:  130s, moderate variability, no decelerations UC:   regular, every 3 minutes SVE:   Dilation: (P) 3 Effacement (%): (P) 80 Station: (P) -2 Exam by:: (P) Linda Cole  Vertex AROM blood tinged, ? Meconium  Labs: Lab Results  Component Value Date   WBC 14.4 (H) 12/25/2018   HGB 12.3 12/25/2018   HCT 36.1 12/25/2018   MCV 85.5 12/25/2018   PLT 188 12/25/2018    Assessment / Plan: IUP @ 35 1/7 weeks Induction due to Preeclampsia with severe features.  Labor: S/p AROM.  Continue Pitocin. Preeclampsia:  Severe range BPs overnight.  Responded well to Labetalol IV 20 mg.  On Magnesium for seizure prophylaxis. Fetal Wellbeing:  Category I Pain Control:  Epidural I/D:  GBS negative Anticipated MOD:  NSVD  Linda Cole 12/25/2018, 8:55 AM

## 2018-12-25 NOTE — Progress Notes (Signed)
Linda Cole MRN: [id number]  Subjective: -0946: Nurse call requests provider to bedside for precipitous delivery.  In room to assess and infant in warmer, mother stable in bed.  Cord clamped and placenta remains in utero.    Objective: BP (!) 150/103   Pulse (!) 111   Temp 98 F (36.7 C) (Oral)   Resp 18   Ht 5' (1.524 m)   Wt 83 kg   LMP 04/22/2018 Comment: 7 weeks   SpO2 100%   BMI 35.74 kg/m  I/O last 3 completed shifts: In: 2112.4 [P.O.:90; I.V.:2022.4] Out: 1250 [Urine:1250] Total I/O In: 565.9 [P.O.:240; I.V.:325.9] Out: 650 [Urine:650]  Vaginal Exam: SVE:   Dilation: 7.5 Effacement (%): 90 Station: 0 Exam by:: J.Cox,RN   Assessment:  Precipitous Delivery Stable   Plan: -Dr. Carroll Sage previously called and at bedside prior to provider exit.  Joyce Copa Charne Mcbrien,MSN, CNM 12/25/2018, 9:57 AM

## 2018-12-25 NOTE — Anesthesia Procedure Notes (Signed)
Epidural Patient location during procedure: OB Start time: 12/25/2018 5:01 AM End time: 12/25/2018 5:09 AM  Staffing Anesthesiologist: Mal Amabile, MD Performed: anesthesiologist   Preanesthetic Checklist Completed: patient identified, site marked, surgical consent, pre-op evaluation, timeout performed, IV checked, risks and benefits discussed and monitors and equipment checked  Epidural Patient position: sitting Prep: site prepped and draped and DuraPrep Patient monitoring: continuous pulse ox and blood pressure Approach: midline Location: L3-L4 Injection technique: LOR air  Needle:  Needle type: Tuohy  Needle gauge: 17 G Needle length: 9 cm and 9 Needle insertion depth: 6 cm Catheter type: closed end flexible Catheter size: 19 Gauge Catheter at skin depth: 11 cm Test dose: negative and Other  Assessment Events: blood not aspirated, injection not painful, no injection resistance, negative IV test and no paresthesia  Additional Notes Patient identified. Risks and benefits discussed including failed block, incomplete  Pain control, post dural puncture headache, nerve damage, paralysis, blood pressure Changes, nausea, vomiting, reactions to medications-both toxic and allergic and post Partum back pain. All questions were answered. Patient expressed understanding and wished to proceed. Sterile technique was used throughout procedure. Epidural site was Dressed with sterile barrier dressing. No paresthesias, signs of intravascular injection Or signs of intrathecal spread were encountered.  Patient was more comfortable after the epidural was dosed. Please see RN's note for documentation of vital signs and FHR which are stable. Reason for block:procedure for pain

## 2018-12-25 NOTE — Lactation Note (Addendum)
This note was copied from a baby's chart. Lactation Consultation Note  Patient Name: Linda Cole Date: 12/25/2018 Reason for consult: Initial assessment;Late-preterm 34-36.6wks;Infant < 6lbs(GTHN)  1236 - 1257 - I conducted an initial lactation visit with Ms. Ying and her 47 week old daughter, Linda Cole. She states that baby has latched initially at delivery. Mom just gave baby 10 mls of 22 cal formula prior to my entry. She used the gold similac slow flow nipple and states that baby tolerated the bottle well.  I reviewed LPT feeding guidelines and recommended that she hold baby skin to skin frequently, limit breast feeding sessions to 30 minutes, and pump and provide formula or expressed breast milk following.  I set up a DEBP for mom and instructed her on settings and pumping norms for days 1-3. I recommended that she pump every three hours post breast feeding.  I showed mom how to clean her pump equipment.   Mom has a [place] DEBP at home.  Mom needs size 30 flanges for her breast pump. I stated that I would return with correct flanges (she has 27 flanges at this time) and to assist with breast feeding.  Mom breast fed her now 24 yo for 18 months with no issues. She breast fed exclusively for 6 months and noticed a decrease in milk production when she returned to work.  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 I stated that I would return with more information about this when I brought her flanges.  No further questions at this time.   1500 - I returned to Ms. Sirmons to deliver her size 30 flanges. Mom has already initiated pumping. I also dropped off her form for her employee breast pump and discussed which pumps we had in stock. Mom will choose a pump and turn her form in at next [facility] consult.  Maternal Data Formula Feeding for Exclusion: No Does the patient have breastfeeding experience prior to this  delivery?: Yes  Feeding Feeding Type: Breast Fed(attempt by mom no latch)  Interventions Interventions: Breast feeding basics reviewed;DEBP(LPT basics reviewed)  Lactation Tools Discussed/Used Tools: Pump Breast pump type: Double-Electric Breast Pump Pump Review: Setup, frequency, and cleaning;Milk Storage Initiated by:: HL Date initiated:: 12/25/18   Consult Status Consult Status: Follow-up Date: 12/25/18 Follow-up type: In-patient    Walker Shadow 12/25/2018, 1:19 PM

## 2018-12-25 NOTE — Anesthesia Postprocedure Evaluation (Signed)
Anesthesia Post Note  Patient: Linda Cole  Procedure(s) Performed: AN AD HOC LABOR EPIDURAL     Patient location during evaluation: Mother Baby Anesthesia Type: Epidural Level of consciousness: awake and alert Pain management: pain level controlled Vital Signs Assessment: post-procedure vital signs reviewed and stable Respiratory status: spontaneous breathing, nonlabored ventilation and respiratory function stable Cardiovascular status: stable Postop Assessment: no headache, no backache, epidural receding, no apparent nausea or vomiting, patient able to bend at knees, able to ambulate and adequate PO intake Anesthetic complications: no    Last Vitals:  Vitals:   12/25/18 1334 12/25/18 1409  BP: (!) 153/105 (!) 145/93  Pulse: 90 90  Resp:  17  Temp:    SpO2:      Last Pain:  Vitals:   12/25/18 1307  TempSrc: Oral  PainSc:    Pain Goal:                   Laban Emperor

## 2018-12-25 NOTE — Anesthesia Preprocedure Evaluation (Addendum)
Anesthesia Evaluation  Patient identified by MRN, date of birth, ID band Patient awake    Reviewed: Allergy & Precautions, Patient's Chart, lab work & pertinent test results  Airway Mallampati: III  TM Distance: >3 FB Neck ROM: Full    Dental no notable dental hx. (+) Teeth Intact   Pulmonary neg pulmonary ROS,    Pulmonary exam normal breath sounds clear to auscultation       Cardiovascular hypertension, Pt. on medications Normal cardiovascular exam Rhythm:Regular Rate:Normal     Neuro/Psych negative neurological ROS  negative psych ROS   GI/Hepatic Neg liver ROS, GERD  ,  Endo/Other  Obesity  Renal/GU negative Renal ROS  negative genitourinary   Musculoskeletal negative musculoskeletal ROS (+)   Abdominal (+) + obese,   Peds  Hematology  (+) Sickle cell trait and anemia ,   Anesthesia Other Findings   Reproductive/Obstetrics (+) Pregnancy Pre eclampsia                            Anesthesia Physical Anesthesia Plan  ASA: II  Anesthesia Plan: Epidural   Post-op Pain Management:    Induction:   PONV Risk Score and Plan:   Airway Management Planned: Natural Airway  Additional Equipment:   Intra-op Plan:   Post-operative Plan:   Informed Consent: I have reviewed the patients History and Physical, chart, labs and discussed the procedure including the risks, benefits and alternatives for the proposed anesthesia with the patient or authorized representative who has indicated his/her understanding and acceptance.       Plan Discussed with: Anesthesiologist  Anesthesia Plan Comments:         Anesthesia Quick Evaluation

## 2018-12-26 LAB — CBC
HCT: 32.5 % — ABNORMAL LOW (ref 36.0–46.0)
Hemoglobin: 11 g/dL — ABNORMAL LOW (ref 12.0–15.0)
MCH: 28.9 pg (ref 26.0–34.0)
MCHC: 33.8 g/dL (ref 30.0–36.0)
MCV: 85.5 fL (ref 80.0–100.0)
Platelets: 171 10*3/uL (ref 150–400)
RBC: 3.8 MIL/uL — ABNORMAL LOW (ref 3.87–5.11)
RDW: 15.6 % — ABNORMAL HIGH (ref 11.5–15.5)
WBC: 12.5 10*3/uL — ABNORMAL HIGH (ref 4.0–10.5)
nRBC: 0 % (ref 0.0–0.2)

## 2018-12-26 LAB — COMPREHENSIVE METABOLIC PANEL
ALT: 12 U/L (ref 0–44)
AST: 20 U/L (ref 15–41)
Albumin: 2.3 g/dL — ABNORMAL LOW (ref 3.5–5.0)
Alkaline Phosphatase: 130 U/L — ABNORMAL HIGH (ref 38–126)
Anion gap: 12 (ref 5–15)
BUN: <5 mg/dL — ABNORMAL LOW (ref 6–20)
CO2: 23 mmol/L (ref 22–32)
Calcium: 7.9 mg/dL — ABNORMAL LOW (ref 8.9–10.3)
Chloride: 101 mmol/L (ref 98–111)
Creatinine, Ser: 0.57 mg/dL (ref 0.44–1.00)
GFR calc Af Amer: >60 mL/min (ref >60)
GFR calc non Af Amer: >60 mL/min (ref >60)
Glucose, Bld: 96 mg/dL (ref 70–99)
Potassium: 3.4 mmol/L — ABNORMAL LOW (ref 3.5–5.1)
Sodium: 136 mmol/L (ref 135–145)
Total Bilirubin: 0.6 mg/dL (ref 0.3–1.2)
Total Protein: 5.4 g/dL — ABNORMAL LOW (ref 6.5–8.1)

## 2018-12-26 MED ORDER — NIFEDIPINE ER OSMOTIC RELEASE 30 MG PO TB24
30.0000 mg | ORAL_TABLET | Freq: Every day | ORAL | Status: DC
  Administered 2018-12-26: 30 mg via ORAL
  Filled 2018-12-26: qty 1

## 2018-12-26 MED ORDER — NIFEDIPINE ER OSMOTIC RELEASE 30 MG PO TB24
60.0000 mg | ORAL_TABLET | Freq: Every day | ORAL | Status: DC
  Administered 2018-12-27: 60 mg via ORAL
  Filled 2018-12-26: qty 2

## 2018-12-26 MED ORDER — NIFEDIPINE ER OSMOTIC RELEASE 30 MG PO TB24: 30.0000 mg | ORAL_TABLET | Freq: Once | ORAL | Status: DC

## 2018-12-26 NOTE — Lactation Note (Signed)
This note was copied from a baby's chart. Lactation Consultation Note  Patient Name: Girl Nyree Weichman [id number] Date: 12/26/2018 Reason for consult: Follow-up assessment;Late-preterm 34-36.6wks Baby is 81 hours old.  Mom states baby latched this morning.  Mom is pumping and obtaining small amounts of colostrum.  Baby is receiving formula supplementations.  Instructed to put baby to breast with feeding cues, post pump and supplement with expressed milk/formula.  Mom given her employee Medela pump in [place].  Encouraged to call for latch assist prn.  Maternal Data    Feeding    LATCH Score                   Interventions    Lactation Tools Discussed/Used     Consult Status Consult Status: Follow-up Date: 12/27/18 Follow-up type: In-patient    Huston Foley 12/26/2018, 12:41 PM

## 2018-12-26 NOTE — Progress Notes (Addendum)
Subjective: Postpartum Day # 1 : S/P NSVD due to IOL for preE with SF, was on magnesium and continues to be on magnesium until 0930. Denies SOB, or decreased reflexes. Pt denies HA, vision changes or RUQ pain. Patient up ad lib, denies syncope or dizziness. Reports consuming regular diet without issues and denies N/V. Patient reports 0 bowel movement + passing flatus.  Denies issues with urination and reports bleeding is "lighter."  Patient is breastfeeding and reports going well.  Desires declined all forms of BC for postpartum contraception.  Pain is being appropriately managed with use of po meds. Neuro check WNL and urine Output adequate.   None laceration Feeding:  breast Contraceptive plan:  declined Baby Female  Objective: Vital signs in last 24 hours: Patient Vitals for the past 24 hrs:  BP Temp Temp src Pulse Resp SpO2  12/26/18 0813 (!) 143/98 - - 86 18 -  12/26/18 0641 - - - - 18 -  12/26/18 0600 - - - - 18 -  12/26/18 0500 - - - - 17 -  12/26/18 0400 - - - - 18 -  12/26/18 0314 - - - - 18 -  12/26/18 0300 (!) 149/106 97.6 F (36.4 C) Oral 84 18 95 %  12/26/18 0200 - - - - 16 -  12/26/18 0100 - - - - 18 -  12/26/18 0000 - - - - 18 -  12/25/18 2315 (!) 157/106 - - 91 - -  12/25/18 2258 (!) 167/107 - - 87 - -  12/25/18 2200 - - - - 18 -  12/25/18 2100 - - - - 16 -  12/25/18 2010 (!) 158/103 98 F (36.7 C) Oral 97 18 100 %  12/25/18 1517 (!) 152/88 98.2 F (36.8 C) Oral 97 17 97 %  12/25/18 1409 (!) 145/93 - - 90 17 -  12/25/18 1334 (!) 153/105 - - 90 - -  12/25/18 1307 (!) 167/106 98.2 F (36.8 C) Oral 96 16 98 %  12/25/18 1202 (!) 158/102 - - (!) 102 - -  12/25/18 1201 (!) 155/98 98.2 F (36.8 C) - (!) 102 16 100 %  12/25/18 1151 (!) 157/105 - - (!) 106 - -  12/25/18 1117 (!) 146/86 - - (!) 102 - -  12/25/18 1102 (!) 145/100 - - (!) 116 - -  12/25/18 1047 (!) 156/111 - - (!) 102 - -  12/25/18 1016 (!) 147/97 - - (!) 105 - -  12/25/18 1002 (!) 138/92 - - (!) 116 -  -  12/25/18 0956 (!) 143/86 - - (!) 135 - -  12/25/18 0951 - - - - - 100 %  12/25/18 0946 - - - - - 100 %  12/25/18 0941 - - - - - 100 %  12/25/18 0936 - - - - - 100 %  12/25/18 0932 (!) 150/103 - - (!) 111 - -  12/25/18 0931 - - - - - 99 %  12/25/18 0926 - - - - - 99 %     Physical Exam:  General: alert, cooperative, appears stated age and no distress Mood/Affect: Happy Lungs: clear to auscultation, no wheezes, rales or rhonchi, symmetric air entry.  Heart: normal rate, regular rhythm, normal S1, S2, no murmurs, rubs, clicks or gallops. Breast: breasts appear normal, no suspicious masses, no skin or nipple changes or axillary nodes. Abdomen:  + bowel sounds, soft, non-tender GU: perineum Intact, healing well. No signs of external hematomas.  Uterine Fundus: firm Lochia: appropriate  Skin: Warm, Dry. DVT Evaluation: No evidence of DVT seen on physical exam. Negative Homan's sign. No cords or calf tenderness. No significant calf/ankle edema. No clonus, 2+ Patellar DTR.   CBC Latest Ref Rng & Units 12/26/2018 12/25/2018 12/25/2018  WBC 4.0 - 10.5 K/uL 12.5(H) 14.5(H) 14.4(H)  Hemoglobin 12.0 - 15.0 g/dL 11.0(L) 12.7 12.3  Hematocrit 36.0 - 46.0 % 32.5(L) 37.9 36.1  Platelets 150 - 400 K/uL 171 176 188    Results for orders placed or performed during the hospital encounter of 12/24/18 (from the past 24 hour(s))  CBC     Status: Abnormal   Collection Time: 12/25/18 10:31 AM  Result Value Ref Range   WBC 14.5 (H) 4.0 - 10.5 K/uL   RBC 4.38 3.87 - 5.11 MIL/uL   Hemoglobin 12.7 12.0 - 15.0 g/dL   HCT 16.1 09.6 - 04.5 %   MCV 86.5 80.0 - 100.0 fL   MCH 29.0 26.0 - 34.0 pg   MCHC 33.5 30.0 - 36.0 g/dL   RDW 40.9 81.1 - 91.4 %   Platelets 176 150 - 400 K/uL   nRBC 0.0 0.0 - 0.2 %  CBC     Status: Abnormal   Collection Time: 12/26/18  5:22 AM  Result Value Ref Range   WBC 12.5 (H) 4.0 - 10.5 K/uL   RBC 3.80 (L) 3.87 - 5.11 MIL/uL   Hemoglobin 11.0 (L) 12.0 - 15.0 g/dL   HCT  78.2 (L) 95.6 - 46.0 %   MCV 85.5 80.0 - 100.0 fL   MCH 28.9 26.0 - 34.0 pg   MCHC 33.8 30.0 - 36.0 g/dL   RDW 21.3 (H) 08.6 - 57.8 %   Platelets 171 150 - 400 K/uL   nRBC 0.0 0.0 - 0.2 %  Comprehensive metabolic panel     Status: Abnormal   Collection Time: 12/26/18  5:22 AM  Result Value Ref Range   Sodium 136 135 - 145 mmol/L   Potassium 3.4 (L) 3.5 - 5.1 mmol/L   Chloride 101 98 - 111 mmol/L   CO2 23 22 - 32 mmol/L   Glucose, Bld 96 70 - 99 mg/dL   BUN <5 (L) 6 - 20 mg/dL   Creatinine, Ser 4.69 0.44 - 1.00 mg/dL   Calcium 7.9 (L) 8.9 - 10.3 mg/dL   Total Protein 5.4 (L) 6.5 - 8.1 g/dL   Albumin 2.3 (L) 3.5 - 5.0 g/dL   AST 20 15 - 41 U/L   ALT 12 0 - 44 U/L   Alkaline Phosphatase 130 (H) 38 - 126 U/L   Total Bilirubin 0.6 0.3 - 1.2 mg/dL   GFR calc non Af Amer >60 >60 mL/min   GFR calc Af Amer >60 >60 mL/min   Anion gap 12 5 - 15     CBG (last 3)  No results for input(s): GLUCAP in the last 72 hours.   I/O last 3 completed shifts: In: 7511.5 [P.O.:3620; I.V.:3891.5] Out: 8100 [Urine:7950; Blood:150]   Assessment Postpartum Day # 2 : S/P NSVD due to PreE with SF due to SR BP. Pt stable. -1 involution. breastfeeding. Hemodynamically stable with post op HGB of 12.3. Pre E with SF: continue on magnesium to be turned off @ 0930 this morning, BP 143/98, on labetalol  BID. This morning labs were plat 171, creatinine 0.57, ast 20, alt 12. PCR on 5/24 were undetectable. Pt stable and asymptomatic.Marland Kitchen  Plan: Continue other mgmt as ordered VTE prophylactics: Early ambulated as tolerates.  Pre  E with SF: magnesium off @ 0930, monitor, BP Q4H, continue labetalol 200mg  BID and increase or switch to procardia if indicated, BP goal <150/100. Pain control: Motrin/Tylenol PRN Education given regarding options for contraception, including barrier methods, injectable contraception, IUD placement, oral contraceptives.  Plan for discharge tomorrow, Breastfeeding, Lactation consult and  Contraception declined   Dr. Mora ApplPinn to be updated on patient status  [facility] Jennie EdmundsonJade Graesyn Schreifels NP-C, CNM 12/26/2018, 9:21 AM   Addendum 4:00 PM: Pt stable, asymptomatic, elevated BP noted, discontinued labetalol 200mg  BIS and started procardia 30mg  XL now. Will monitor BP, Urine output adequate. Magnesium d/c'ed @ 1000am BP (!) 148/101 (BP Location: Right Arm)   Pulse 89   Temp 97.6 F (36.4 C) (Oral)   Resp 18   Ht 5' (1.524 m)   Wt 83 kg   LMP 04/22/2018 Comment: 7 weeks   SpO2 98%   Breastfeeding Unknown   BMI 35.74 kg/m

## 2018-12-27 MED ORDER — NIFEDIPINE ER 60 MG PO TB24: 60.0000 mg | ORAL_TABLET | Freq: Every day | ORAL | 2 refills | Status: DC

## 2018-12-27 MED ORDER — IBUPROFEN 600 MG PO TABS: 600.0000 mg | ORAL_TABLET | Freq: Four times a day (QID) | ORAL | 0 refills | Status: DC

## 2018-12-27 NOTE — Lactation Note (Signed)
This note was copied from a baby's chart. Lactation Consultation Note  Patient Name: Linda Cole [id number] Date: 12/27/2018 Reason for consult: Follow-up assessment;Late-preterm 34-36.6wks;Infant < 6lbs Baby is 50 hours old.  Mom is pumping every 3 hours and obtaining small amounts of colostrum.  Breasts are comfortable.  Mom is concerned about milk supply.  Reassured and discussed milk coming to volume.  Recommended outpatient lactation appointment in the next week or two to assist and support breastfeeding.  Maternal Data    Feeding Feeding Type: Bottle Fed - Formula Nipple Type: Slow - flow  LATCH Score                   Interventions    Lactation Tools Discussed/Used     Consult Status Consult Status: Complete Follow-up type: Call as needed    Huston Foley 12/27/2018, 12:36 PM

## 2018-12-27 NOTE — Discharge Summary (Signed)
OB Discharge Summary     Patient Name: Linda Cole DOB: 1985/08/31 MRN: [id number]  Date of admission: 12/24/2018 Delivering MD: Venda Rodes B   Date of discharge: 12/27/2018  Admitting diagnosis: INDUCTION Intrauterine pregnancy: [redacted]w[redacted]d     Secondary diagnosis:  Active Problems:   Hypertension in pregnancy, preeclampsia, severe, antepartum, third trimester  Additional problems: None     Discharge diagnosis: Term Pregnancy Delivered            Pre eclampsia with severe features                                                                             Post partum procedures:None  Augmentation: AROM, Pitocin and Cytotec  Complications: None  Hospital course:  Induction of Labor With Vaginal Delivery   33 y.o. yo [id number] at [redacted]w[redacted]d was admitted to the hospital 12/24/2018 for induction of labor.  Indication for induction: Preeclampsia.  Patient had an uncomplicated labor course as follows: Membrane Rupture Time/Date: 8:50 AM ,12/25/2018   Intrapartum Procedures: Episiotomy: None [1]                                         Lacerations:  None [1]  Patient had delivery of a Viable infant.  Information for the patient's newborn:  Terlizzi, Girl Maribi [[id number]]  Delivery Method: Vaginal, Spontaneous(Filed from Delivery Summary)   12/25/2018  Details of delivery can be found in separate delivery note.  Patient had a routine postpartum course. Patient is discharged home 12/27/18.  Physical exam  Vitals:   12/26/18 2337 12/27/18 0529 12/27/18 0746 12/27/18 0800  BP: (!) 146/87 (!) 147/94 (!) 136/91 125/83  Pulse: 86 (!) 105 91 94  Resp: 20 18  18   Temp: 98.2 F (36.8 C) 98.9 F (37.2 C)  99.1 F (37.3 C)  TempSrc: Oral Oral  Oral  SpO2: 96% 98%  96%  Weight:      Height:       General: alert, cooperative and no distress Lochia: appropriate Uterine Fundus: firm Incision: N/A DVT Evaluation: No evidence of DVT seen on physical exam. Labs: Lab Results  Component Value Date    WBC 12.5 (H) 12/26/2018   HGB 11.0 (L) 12/26/2018   HCT 32.5 (L) 12/26/2018   MCV 85.5 12/26/2018   PLT 171 12/26/2018   CMP Latest Ref Rng & Units 12/26/2018  Glucose 70 - 99 mg/dL 96  BUN 6 - 20 mg/dL <8(N)  Creatinine 8.67 - 1.00 mg/dL 6.72  Sodium 094 - 709 mmol/L 136  Potassium 3.5 - 5.1 mmol/L 3.4(L)  Chloride 98 - 111 mmol/L 101  CO2 22 - 32 mmol/L 23  Calcium 8.9 - 10.3 mg/dL 7.9(L)  Total Protein 6.5 - 8.1 g/dL 6.2(E)  Total Bilirubin 0.3 - 1.2 mg/dL 0.6  Alkaline Phos 38 - 126 U/L 130(H)  AST 15 - 41 U/L 20  ALT 0 - 44 U/L 12    Discharge instruction: per After Visit Summary and "Baby and Me Booklet".  After visit meds:  Ibuprofen, PNV Procardia XL 60 mg  Diet: low salt diet  Activity:  Advance as tolerated. Pelvic rest for 6 weeks.   Outpatient follow up:2 weeks Follow up Appt: Future Appointments  Date Time Provider Department Center  06/08/2019  3:00 PM Lada, Janit BernMelinda P, MD [facility]   Follow up Visit:No follow-ups on file.  Postpartum contraception: Undecided  Newborn Data: Live born female  Birth Weight: 5 lb 5.4 oz (2420 g) APGAR: 8, 9  Newborn Delivery   Birth date/time:  12/25/2018 09:44:00 Delivery type:  Vaginal, Spontaneous     Baby Feeding: Breast Disposition:home with mother   12/27/2018 Kenney HousemanNancy Jean Shalah Estelle, CNM

## 2018-12-28 ENCOUNTER — Ambulatory Visit: Payer: Self-pay

## 2018-12-28 NOTE — Lactation Note (Signed)
This note was copied from a baby's chart. Lactation Consultation Note  Patient Name: Linda Cole [id number] Date: 12/28/2018 Reason for consult: Follow-up assessment;Late-preterm 34-36.6wks;Difficult latch;Infant < 6lbs  P2 mother whose infant is now 73 hours old.  This is a LPTI at 35+2 weeks weighing < 6 lbs.  Mother breast fed her first child (now 31 years old) for 18 months.  Baby was asleep in the bassinet when I arrived.  Mother had no questions/concerns related to breast feeding.  She has not put baby to breast since 1040 on 12/25/18.  Mother desires to wait until she gets home to begin more breast feeding.  She also stated that her nipples are too large for baby's mouth at this time.  Since mother was finishing pumping when I arrived I was able to observe her breasts to be soft and non tender and nipples are large and everted.  Mother pumped a full small bottle of milk from both breasts.  She originally was given a #30 flange but has changed back to the #27 flange stating it felt better.    Engorgement prevention/treatment discussed.  Mother has a DEBP for home use.  She was also given her employee Medela pump.  Manual pump provided with instructions for use.  Mother will continue to attempt breast feeding at home and will pump every 3 hours.  She has our OP phone number for questions after discharge.  Father present.  Family is hoping to be discharged today.  Mother stated that she has been in the hospital for a week now and misses her 49 year old daughter.   Maternal Data Formula Feeding for Exclusion: No Has patient been taught Hand Expression?: Yes Does the patient have breastfeeding experience prior to this delivery?: Yes  Feeding    LATCH Score                   Interventions    Lactation Tools Discussed/Used     Consult Status Consult Status: Complete Date: 12/28/18 Follow-up type: Call as needed    Jhonathan Desroches R Julianah Marciel 12/28/2018, 10:03 AM

## 2019-02-26 ENCOUNTER — Ambulatory Visit (INDEPENDENT_AMBULATORY_CARE_PROVIDER_SITE_OTHER): Payer: No Typology Code available for payment source | Admitting: Nurse Practitioner

## 2019-02-26 ENCOUNTER — Other Ambulatory Visit: Payer: Self-pay

## 2019-02-26 ENCOUNTER — Encounter: Payer: Self-pay | Admitting: Nurse Practitioner

## 2019-02-26 VITALS — Resp 16

## 2019-02-26 DIAGNOSIS — D573 Sickle-cell trait: Secondary | ICD-10-CM

## 2019-02-26 DIAGNOSIS — B360 Pityriasis versicolor: Secondary | ICD-10-CM

## 2019-02-26 DIAGNOSIS — R21 Rash and other nonspecific skin eruption: Secondary | ICD-10-CM | POA: Diagnosis not present

## 2019-02-26 MED ORDER — TERBINAFINE HCL 1 % EX CREA: 1.0000 "application " | TOPICAL_CREAM | Freq: Two times a day (BID) | CUTANEOUS | 3 refills | Status: DC

## 2019-02-26 MED FILL — SM ATHLETE'S 1% FOOT CREAM: 1 % | 15 days supply | Qty: 30 | Fill #0

## 2019-02-26 NOTE — Progress Notes (Signed)
Virtual Visit via Video Note  I connected with Linda Cole on 02/26/19 at  1:40 PM EDT by a video enabled telemedicine application and verified that I am speaking with the correct person using two identifiers.   Staff discussed the limitations of evaluation and management by telemedicine and the availability of in person appointments. The patient expressed understanding and agreed to proceed.  Patient location: home  My location: work office Other people present:  none HPI  On Sunday noticed small flat white spots on bilateral foot, that are not itchy, painful, no odor.  Has not worsened or progressed. Has not noticed excessive sweating with feet but Last Wednesday helped a friend move and was more active then. She thought it might be a fungal infection and did one apple cider vinegar soak without relief.    Sickle cell trait- stays hydrated, no issues.    PHQ2/9: Depression screen [facility] 2/9 02/26/2019 06/04/2018 09/03/2017 06/02/2017 01/30/2017  Decreased Interest 0 0 0 0 0  Down, Depressed, Hopeless 0 0 0 0 0  PHQ - 2 Score 0 0 0 0 0  Altered sleeping 0 0 - - -  Tired, decreased energy 0 3 - - -  Change in appetite 0 0 - - -  Feeling bad or failure about yourself  0 0 - - -  Trouble concentrating 0 0 - - -  Moving slowly or fidgety/restless 0 0 - - -  Suicidal thoughts 0 0 - - -  PHQ-9 Score 0 3 - - -  Difficult doing work/chores Not difficult at all Not difficult at all - - -     PHQ reviewed. Negative  Patient Active Problem List   Diagnosis Date Noted  . Hypertension in pregnancy, preeclampsia, severe, antepartum, third trimester 12/24/2018  . Elevated blood pressure affecting pregnancy in third trimester, antepartum 12/20/2018  . Preventative health care 06/02/2017  . Acanthosis nigricans 06/02/2017  . Obesity (BMI 35.0-39.9 without comorbidity) 06/02/2017  . Sickle cell trait ([place]) 02/04/2017  . Hx of abnormal cervical Pap smear 02/04/2017  . Anemia 01/30/2017  . Elevated LDL  cholesterol level 01/30/2017    Past Medical History:  Diagnosis Date  . Anemia   . Hypokalemia   . Pregnancy induced hypertension   . Sickle cell trait ([place])   . Vaginal Pap smear, abnormal     Past Surgical History:  Procedure Laterality Date  . NO PAST SURGERIES      Social History   Tobacco Use  . Smoking status: Never Smoker  . Smokeless tobacco: Never Used  Substance Use Topics  . Alcohol use: Not Currently    Comment: social     Current Outpatient Medications:  .  Prenatal Vit-Fe Fumarate-FA (MULTIVITAMIN-PRENATAL) 27-0.8 MG TABS tablet, Take 1 tablet by mouth daily at 12 noon., Disp: , Rfl:  .  ibuprofen (ADVIL) 600 MG tablet, Take 1 tablet (600 mg total) by mouth every 6 (six) hours. (Patient not taking: Reported on 02/26/2019), Disp: 30 tablet, Rfl: 0 .  NIFEdipine (ADALAT CC) 60 MG 24 hr tablet, Take 1 tablet (60 mg total) by mouth daily. (Patient not taking: Reported on 02/26/2019), Disp: 30 tablet, Rfl: 2  Allergies  Allergen Reactions  . Sulfa Antibiotics Other (See Comments)    Child hood allergy, unknown reaction  . Sulfamethoxazole     Unknown; childhood reaction. Patient states she thinks it was a rash    ROS   No other specific complaints in a complete review of systems (except  as listed in HPI above).  Objective  There were no vitals filed for this visit.  There is no height or weight on file to calculate BMI.  Nursing Note and Vital Signs reviewed.  Physical Exam  Constitutional: Patient appears well-developed and well-nourished. No distress.  HENT: Head: Normocephalic and atraumatic. Pulmonary/Chest: Effort normal  Neurological: alert and oriented, speech normal.  Skin: bilaterally top of foot and in ankle has small light- discolored areas,  Psychiatric: Patient has a normal mood and affect. behavior is normal. Judgment and thought content normal.    Assessment & Plan  1. Tinea versicolor - terbinafine (LAMISIL) 1 % cream; Apply  1 application topically 2 (two) times daily.  Dispense: 30 g; Refill: 3  2. Rash - Ambulatory referral to Dermatology  3. Sickle cell trait (HCC) Hydration    Follow Up Instructions:  PRN   I discussed the assessment and treatment plan with the patient. The patient was provided an opportunity to ask questions and all were answered. The patient agreed with the plan and demonstrated an understanding of the instructions.   The patient was advised to call back or seek an in-person evaluation if the symptoms worsen or if the condition fails to improve as anticipated.  I provided 11 minutes of non-face-to-face time during this encounter.   Cheryle HorsfallElizabeth E Myreon Wimer, NP

## 2019-02-26 NOTE — Patient Instructions (Addendum)
- use terbinafine (LAMISIL) 1 % cream twice a day for at least one week and continue until area has resolved (no longer than 6 weeks) If rash is not improving please follow-up with dermatology, Skin discoloration can take a few months to completely resolve.  Tinea Versicolor  Tinea versicolor is a common fungal infection of the skin. It causes a rash that appears as light or dark patches on the skin. The rash most often occurs on the chest, back, neck, or upper arms. This condition is more common during warm weather. Other than affecting how your skin looks, tinea versicolor usually does not cause other problems. In most cases, the infection goes away in a few weeks with treatment. It may take a few months for the patches on your skin to return to your usual skin color. What are the causes? This condition occurs when a type of fungus that is normally present on the skin starts to overgrow. This fungus is a kind of yeast. The exact cause of the overgrowth is not known. This condition cannot be passed from one person to another (it is not contagious). What increases the risk? This condition is more likely to develop when certain factors are present, such as:  Heat and humidity.  Sweating too much.  Hormone changes.  Oily skin.  A weak disease-fighting system (immunesystem). What are the signs or symptoms? Symptoms of this condition include:  A rash of light or dark patches on your skin. The rash may have: ? Patches of tan or pink spots (on light skin). ? Patches of white or brown spots (on dark skin). ? Patches of skin that do not tan. ? Well-marked edges. ? Scales on the discolored areas.  Mild itching. How is this diagnosed? A health care provider can usually diagnose this condition by looking at your skin. During the exam, he or she may use ultraviolet (UV) light to see how much of your skin has been affected. In some cases, a skin sample may be taken by scraping the rash. This sample  will be viewed under a microscope to check for yeast overgrowth. How is this treated? Treatment for this condition may include:  Dandruff shampoo that is applied to the affected skin during showers or bathing.  Over-the-counter medicated skin cream, lotion, or soaps.  Prescription antifungal medicine in the form of skin cream or pills.  Medicine to help reduce itching. Follow these instructions at home:  Take over-the-counter and prescription medicines only as told by your health care provider.  Apply dandruff shampoo to the affected area if your health care provider told you to do that. You may be instructed to scrub the affected skin for several minutes each day.  Do not scratch the affected area of skin.  Avoid hot and humid conditions.  Do not use tanning booths.  Try to avoid sweating a lot. Contact a health care provider if:  Your symptoms get worse.  You have a fever.  You have redness, swelling, or pain at the site of your rash.  You have fluid or blood coming from your rash.  Your rash feels warm to the touch.  You have pus or a bad smell coming from your rash.  Your rash returns (recurs) after treatment. Summary  Tinea versicolor is a common fungal infection of the skin. It causes a rash that appears as light or dark patches on the skin.  The rash most often occurs on the chest, back, neck, or upper arms.  A  health care provider can usually diagnose this condition by looking at your skin.  Treatment may include applying shampoo to the skin and taking or applying medicines. This information is not intended to replace advice given to you by your health care provider. Make sure you discuss any questions you have with your health care provider. Document Released: 07/12/2000 Document Revised: 06/27/2017 Document Reviewed: 03/18/2017 Elsevier Patient Education  2020 [employer].

## 2019-04-08 MED FILL — SM ATHLETE'S 1% FOOT CREAM: 1 % | 15 days supply | Qty: 30 | Fill #1

## 2019-06-08 ENCOUNTER — Encounter: Payer: No Typology Code available for payment source | Admitting: Family Medicine

## 2019-08-10 ENCOUNTER — Telehealth: Payer: Self-pay | Admitting: *Deleted

## 2019-08-10 NOTE — Telephone Encounter (Signed)
Tried calling patient to cancel appointment and see if she wanted to see one of the other providers in the office taking new patients. Unable to leave message, mailbox full. Copied from CRM [id number]. Topic: Appointment Scheduling - Scheduling Inquiry for Clinic >> Aug 09, 2019  5:10 PM Floria Raveling A wrote: Reason for CRM: pt called in and needs to rech her new pt appt.  Due Dr banks no longer taking new pt I will not allow me to resch.  Please call pt to resch Best number [phone]

## 2019-08-11 ENCOUNTER — Ambulatory Visit: Payer: No Typology Code available for payment source | Admitting: Family Medicine

## 2019-09-22 ENCOUNTER — Encounter: Payer: Self-pay | Admitting: Family Medicine

## 2019-09-22 ENCOUNTER — Ambulatory Visit (INDEPENDENT_AMBULATORY_CARE_PROVIDER_SITE_OTHER): Payer: 59 | Admitting: Family Medicine

## 2019-09-22 ENCOUNTER — Other Ambulatory Visit: Payer: Self-pay

## 2019-09-22 VITALS — BP 108/86 | HR 76 | Temp 97.8°F | Ht 60.0 in | Wt 181.0 lb

## 2019-09-22 DIAGNOSIS — H04551 Acquired stenosis of right nasolacrimal duct: Secondary | ICD-10-CM

## 2019-09-22 DIAGNOSIS — Z7689 Persons encountering health services in other specified circumstances: Secondary | ICD-10-CM

## 2019-09-22 MED ORDER — PREDNISOLONE ACETATE 1 % OP SUSP: 1.0000 [drp] | Freq: Two times a day (BID) | OPHTHALMIC | 0 refills | Status: AC

## 2019-09-22 MED FILL — PREDNISOLONE AC 1% EYE DROP: 1 | 50 days supply | Qty: 5 | Fill #0

## 2019-09-22 NOTE — Progress Notes (Signed)
Patient presents to clinic today to establish care.  SUBJECTIVE: PMH: Pt is a 34 yo female with pmh sig for HTN in pregnancy, preE, sickel cell trait.  Pt previously seen at [facility] in [place].  R eye concern: -pt notes R eye watery since dx'd with COVID-19 in January 2021 -watering irritates skin of lower eye lid -notes mild crusting in am and occasional redness of eye -denies purulent drainage -thought was related to allergies, but has not noticed a difference after taking allergy meds. -given abx gtts a few wks ago -pt notes in the past having a needle inserted in her lower eye lid to open or drain something.  Allergies: Sulfa drugs-unsure of reaction  Social history: Patient is married.  Patient currently works as an Administrator for [employer].  Patient has a bachelor's degree in nursing.  Patient has 2 daughters a 51-year-old and a 53-month-old.  Patient denies tobacco or drug use.  Patient endorses rare alcohol use.  Family medical history: Mom-alive, HLD Dad-alive, ESRD on HD, HTN Daughters x2 MGM-Alive, arthritis, COPD PGM-deceased, HTN  Health Maintenance: Immunizations --influenza vaccine 2020, tetanus vaccine 2011 or 2012 PAP -- 06/2019 LMP--08/25/2019    Past Medical History:  Diagnosis Date  . Anemia   . Hypokalemia   . Pregnancy induced hypertension   . Sickle cell trait ([place])   . Vaginal Pap smear, abnormal     Past Surgical History:  Procedure Laterality Date  . NO PAST SURGERIES      Current Outpatient Medications on File Prior to Visit  Medication Sig Dispense Refill  . Prenatal Vit-Fe Fumarate-FA (MULTIVITAMIN-PRENATAL) 27-0.8 MG TABS tablet Take 1 tablet by mouth daily at 12 noon.     No current facility-administered medications on file prior to visit.    Allergies  Allergen Reactions  . Sulfa Antibiotics Other (See Comments)    Child hood allergy, unknown reaction  . Sulfamethoxazole     Unknown; childhood reaction. Patient states she  thinks it was a rash    Family History  Problem Relation Age of Onset  . Diabetes Maternal Grandfather   . Hypertension Paternal Grandmother   . Diabetes Paternal Grandmother   . COPD Paternal Grandmother   . Hypertension Paternal Grandfather   . Diabetes Paternal Grandfather   . COPD Paternal Grandfather   . Hyperlipidemia Mother   . Kidney disease Father        on dyalsis  . Eczema Daughter   . COPD Maternal Grandmother   . Hypertension Maternal Grandmother     Social History   Socioeconomic History  . Marital status: Married    Spouse name: Harrell Gave  . Number of children: 1  . Years of education: 2  . Highest education level: Bachelor's degree (e.g., BA, AB, BS)  Occupational History  . Not on file  Tobacco Use  . Smoking status: Never Smoker  . Smokeless tobacco: Never Used  Substance and Sexual Activity  . Alcohol use: Not Currently    Comment: social  . Drug use: No  . Sexual activity: Yes    Birth control/protection: None  Other Topics Concern  . Not on file  Social History Narrative  . Not on file   Social Determinants of Health   Financial Resource Strain:   . Difficulty of Paying Living Expenses: Not on file  Food Insecurity:   . Worried About Charity fundraiser in the Last Year: Not on file  . Ran Out of Food in the Last Year:  Not on file  Transportation Needs:   . Lack of Transportation (Medical): Not on file  . Lack of Transportation (Non-Medical): Not on file  Physical Activity:   . Days of Exercise per Week: Not on file  . Minutes of Exercise per Session: Not on file  Stress:   . Feeling of Stress : Not on file  Social Connections:   . Frequency of Communication with Friends and Family: Not on file  . Frequency of Social Gatherings with Friends and Family: Not on file  . Attends Religious Services: Not on file  . Active Member of Clubs or Organizations: Not on file  . Attends Banker Meetings: Not on file  . Marital  Status: Not on file  Intimate Partner Violence:   . Fear of Current or Ex-Partner: Not on file  . Emotionally Abused: Not on file  . Physically Abused: Not on file  . Sexually Abused: Not on file    ROS General: Denies fever, chills, night sweats, changes in weight, changes in appetite HEENT: Denies headaches, ear pain, changes in vision, rhinorrhea, sore throat  +watery R eye CV: Denies CP, palpitations, SOB, orthopnea Pulm: Denies SOB, cough, wheezing GI: Denies abdominal pain, nausea, vomiting, diarrhea, constipation GU: Denies dysuria, hematuria, frequency, vaginal discharge Msk: Denies muscle cramps, joint pains Neuro: Denies weakness, numbness, tingling Skin: Denies rashes, bruising Psych: Denies depression, anxiety, hallucinations  BP 108/86 (BP Location: Left Arm, Patient Position: Sitting, Cuff Size: Large)   Pulse 76   Temp 97.8 F (36.6 C) (Temporal)   Ht 5' (1.524 m)   Wt 181 lb (82.1 kg)   LMP 08/25/2019 (Exact Date)   SpO2 98%   Breastfeeding Yes   BMI 35.35 kg/m   Physical Exam Gen. Pleasant, well developed, well-nourished, in NAD HEENT - [place]/AT, PERRL, EOMI, conjunctive clear, R eye watery, fluid clear.  Mild TTP of medial commissure and lacrimal caruncle.   no scleral icterus, no nasal drainage, TMs normal b/l Lungs: no use of accessory muscles, CTAB, no wheezes, rales or rhonchi Cardiovascular: RRR, No r/g/m, no peripheral edema Neuro:  A&Ox3, CN II-XII intact, normal gait Skin:  Warm, dry, intact, no lesions  No results found for this or any previous visit (from the past 2160 hour(s)).  Assessment/Plan: Dacryostenosis of right nasolacrimal duct  -warm compresses -for continued symptoms refer to Ophthalmology - Plan: prednisoLONE acetate (PRED FORTE) 1 % ophthalmic suspension  Encounter to establish care -We reviewed the PMH, PSH, FH, SH, Meds and Allergies. -We provided refills for any medications we will prescribe as needed. -We addressed current  concerns per orders and patient instructions. -We have asked for records for pertinent exams, studies, vaccines and notes from previous providers. -We have advised patient to follow up per instructions below.   F/u prn  Abbe Amsterdam, MD

## 2019-09-22 NOTE — Patient Instructions (Signed)
Dacryocystitis Dacryocystitis is an infection of the sac that collects tears (lacrimal sac). The lacrimal sac is located between the inner corner of the eye and the nose. The glands of the eyelids make tears that keep the surface of the eye wet and protected. Tears drain from two small tubes (ducts) in the eyelids. These ducts carry tears to the lacrimal sac. Another tube (nasolacrimal duct) carries tears from the lacrimal sac down into the back of the nose to the throat. Dacryocystitis can be sudden (acute) or long-lasting (chronic). It usually affects only one eye. What are the causes? The most common cause of this condition is a blocked nasolacrimal duct. When this duct is blocked, tears cannot drain into the nose, and tears become backed up in the lacrimal sac. Bacteria that normally live in the eye, on the skin, or in the nose start to grow inside the sac and cause infection. The nasolacrimal duct may become blocked because of:  A nose or sinus infection that spreads into the duct.  A duct that is abnormally shaped (malformed).  A growth or swelling in the nose.  An injury or surgery that narrows or scars the duct. Dacryocystitis also may start as an eye infection that spreads to the lacrimal sac. Sometimes, the cause of dacryocystitis is not known. What increases the risk? You are more likely to develop this condition if you:  Are older than 32 years of age.  Are female. Women tend to have a narrower nasolacrimal duct than men.  Have had nasal trauma, such as a broken nose or nasal surgery.  Have nasal polyps. What are the signs or symptoms? Symptoms of acute dacryocystitis start suddenly and may include:  Excessive tearing.  A matted, watery eye.  Swelling and redness over the lacrimal sac.  Discharge of mucus or pus into the eye. This may cause blurred vision.  Eye pain.  A fever. Symptoms of chronic dacryocystitis usually include:  More tearing than  usual.  Discharge of mucus or pus into the eye.  Blurred vision. Redness, pain, and swelling are less common with chronic dacryocystitis. How is this diagnosed? This condition is diagnosed based on your medical history and a physical exam. During the exam, your health care provider may press between your eye and the side of your nose to see if discharge flows back into your eye. You may also have tests, such as:  Removal of a sample of discharge from your eye or nose to check for infection.  A test where your health care provider will put a yellow dye in your eye to see if the dye disappears from your eye (dye disappearance test). A swab may be placed in your nose to see if the dye drains to your nose.  A test where a thin, lighted scope (endoscope) is placed in your nose to determine what is causing the duct blockage (nasal endoscopy). How is this treated? Acute dacryocystitis is treated with antibiotic medicines. These are usually given by mouth (orally), but they can also be given as eye drops or ointments. If the infection has spread to tissues around the eye (orbital cellulitis), antibiotics may be given through an IV. Chronic dacryocystitis usually needs to be treated with surgery. Surgical options include:  Probing the duct to open it.  Widening the duct.  Removing a nasal blockage. Follow these instructions at home: Medicines  Take over-the-counter and prescription medicines only as told by your health care provider.  Take or apply your antibiotic medicine,   drops, or ointment as told by your health care provider. Do not stop taking or applying the antibiotic even if you start to feel better. General instructions  If directed by your health care provider, apply a clean, warm compress to the inside corner of your eye. To do this: ? Wash your hands first. ? Hold the compress over the inside corner of your eye for a few minutes. ? Repeat this every few hours during the  day.  Keep all follow-up visits as told by your health care provider. This is important. Contact a health care provider if:  You have a fever.  Your symptoms come back, do not improve, or get worse. Get help right away if you have:  Redness, swelling, and pain that spread to the tissues around your eye.  A sudden decrease in your vision. Summary  Dacryocystitis can be sudden (acute) or long-lasting (chronic).  The most common cause of this condition is a blocked nasolacrimal duct.  Acute dacryocystitis is treated with antibiotic medicines.  Chronic dacryocystitis usually needs to be treated with surgery.  Keep all follow-up visits as told by your health care provider. This is important. This information is not intended to replace advice given to you by your health care provider. Make sure you discuss any questions you have with your health care provider. Document Revised: 06/09/2018 Document Reviewed: 06/09/2018 Elsevier Patient Education  2020 [employer] Inc[employer]  

## 2020-01-17 ENCOUNTER — Other Ambulatory Visit: Payer: Self-pay

## 2020-01-17 ENCOUNTER — Ambulatory Visit (INDEPENDENT_AMBULATORY_CARE_PROVIDER_SITE_OTHER): Payer: 59 | Admitting: Family Medicine

## 2020-01-17 ENCOUNTER — Encounter: Payer: Self-pay | Admitting: Family Medicine

## 2020-01-17 VITALS — BP 110/60 | HR 73 | Temp 98.7°F | Wt 179.4 lb

## 2020-01-17 DIAGNOSIS — R590 Localized enlarged lymph nodes: Secondary | ICD-10-CM

## 2020-01-17 DIAGNOSIS — J029 Acute pharyngitis, unspecified: Secondary | ICD-10-CM

## 2020-01-17 LAB — POCT RAPID STREP A (OFFICE): Rapid Strep A Screen: NEGATIVE

## 2020-01-17 NOTE — Progress Notes (Signed)
   Subjective:    Patient ID: Linda Cole, female    DOB: 1986/02/05, 34 y.o.   MRN: [id number]  HPI Here for a swollen lymph node under the left jaw for the past 2 weeks. She feels well in general, no fever or body aches or ST or sinus congestion or cough. She had a dental cleaning one week ago and they noted a few cavities, but there was no gum inflammation. She developed a mild left ear pain this past weekend and she had a televisit with someone (there is nothing in her chart). They prescribed Augmentin. She took 3 doses of this and then stopped because the earache went away. She notes that she is [redacted] weeks pregnant.    Review of Systems  Constitutional: Negative.   HENT: Negative.   Eyes: Negative.   Respiratory: Negative.   Hematological: Positive for adenopathy.       Objective:   Physical Exam Constitutional:      Appearance: Normal appearance. She is well-developed. She is not ill-appearing.  HENT:     Right Ear: Tympanic membrane, ear canal and external ear normal.     Left Ear: Tympanic membrane, ear canal and external ear normal.     Nose: Nose normal.     Mouth/Throat:     Pharynx: Oropharynx is clear.  Eyes:     Conjunctiva/sclera: Conjunctivae normal.  Neck:     Comments: Single small tender node under the left angle of the jaw.  Pulmonary:     Effort: Pulmonary effort is normal.     Breath sounds: Normal breath sounds.  Neurological:     Mental Status: She is alert.           Assessment & Plan:  Cervical adenopathy of uncertain etiology. I told her we will simply observe this for awhile. I instructed her to not take any more antibiotics. It should resolve in the next week or two, if not she will follow up.  Gershon Crane, MD

## 2020-01-27 DIAGNOSIS — Z3480 Encounter for supervision of other normal pregnancy, unspecified trimester: Secondary | ICD-10-CM | POA: Diagnosis not present

## 2020-01-27 DIAGNOSIS — Z3201 Encounter for pregnancy test, result positive: Secondary | ICD-10-CM | POA: Diagnosis not present

## 2020-01-27 DIAGNOSIS — Z3481 Encounter for supervision of other normal pregnancy, first trimester: Secondary | ICD-10-CM | POA: Diagnosis not present

## 2020-02-02 IMAGING — CT CT ANGIO CHEST
2 of 8 series · 18 of 46 positions shown · IV contrast (iopamidol)
Comparison: None.

CLINICAL DATA: Dyspnea x3 days without chest pain. Patient is 7
weeks pregnant and was shielded for this study.

EXAM:
CT ANGIOGRAPHY CHEST WITH CONTRAST
TECHNIQUE: Multidetector CT imaging of the chest was performed using the
standard protocol during bolus administration of intravenous
contrast. Multiplanar CT image reconstructions and MIPs were
obtained to evaluate the vascular anatomy.
CONTRAST:  70mL 54XR6H-V32 IOPAMIDOL (54XR6H-V32) INJECTION 76%

[Series 6: thins · axial · 0.96mm/px · z∈[+1085,+1334]mm · 15 of 275 slices shown]
[im 13/275  lung]
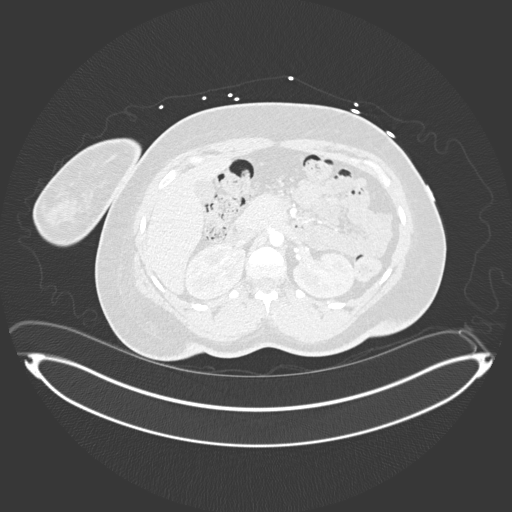
[im 38/275  soft-tissue]
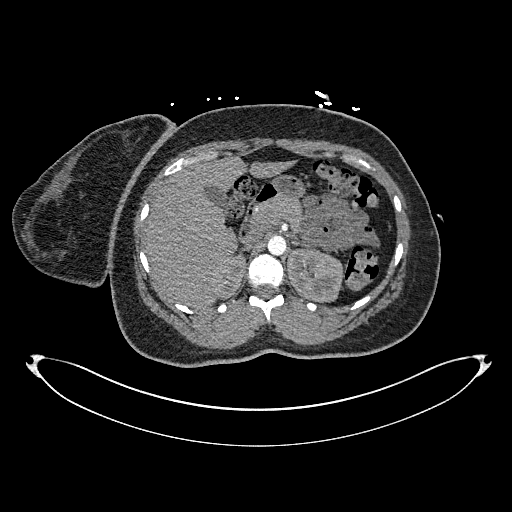
[im 50/275  lung]
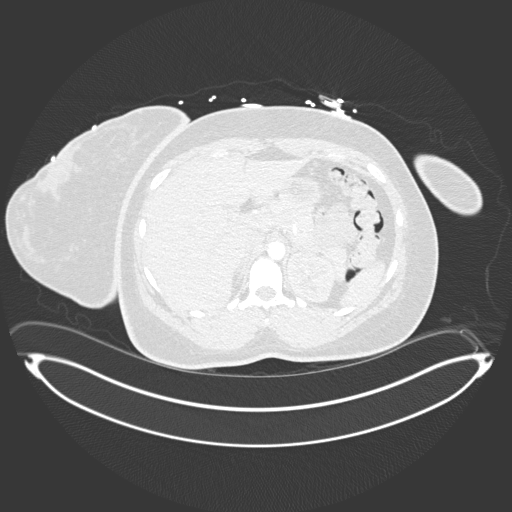
[im 63/275  soft-tissue]
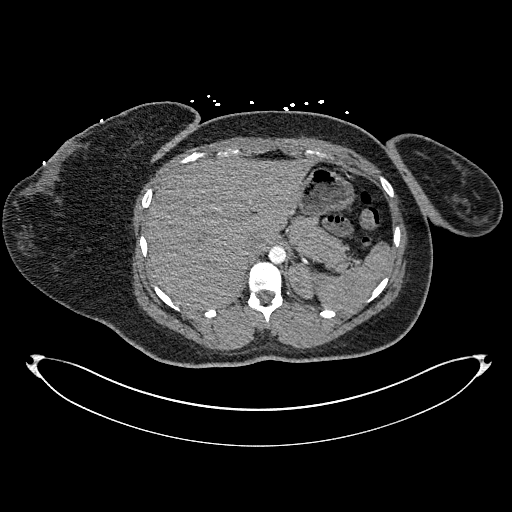
[im 88/275  lung]
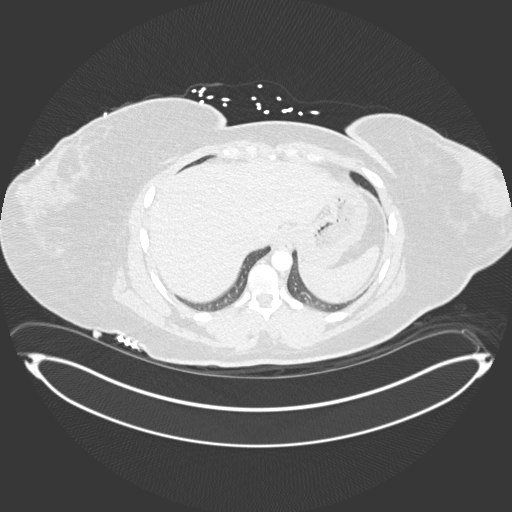
[im 100/275  soft-tissue]
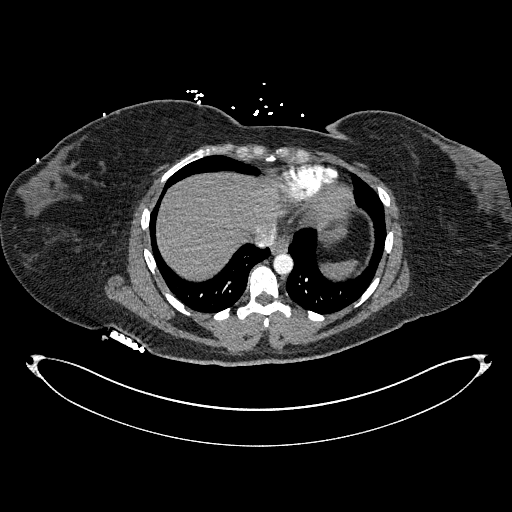
[im 125/275  lung]
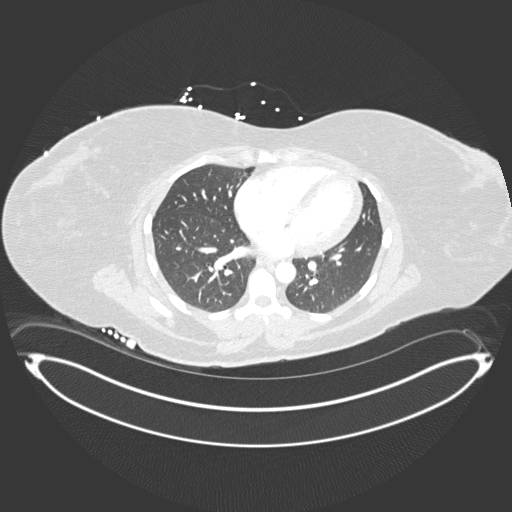
[im 138/275  soft-tissue]
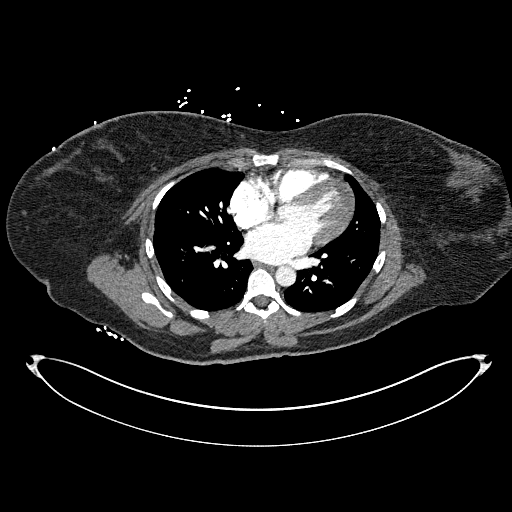
[im 150/275  lung]
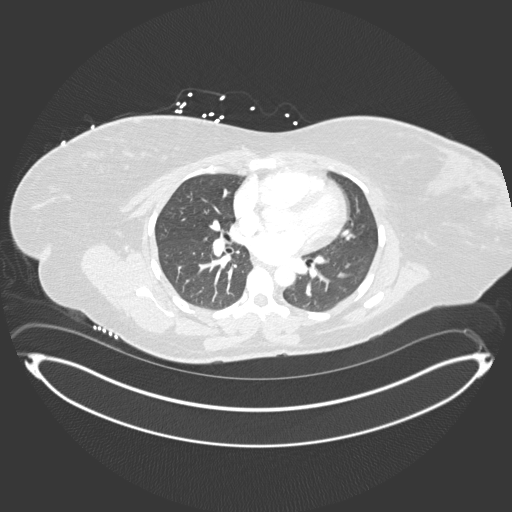
[im 175/275  soft-tissue]
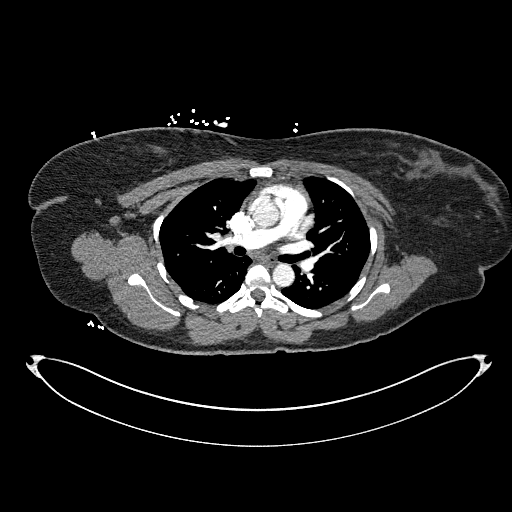
[im 187/275  lung]
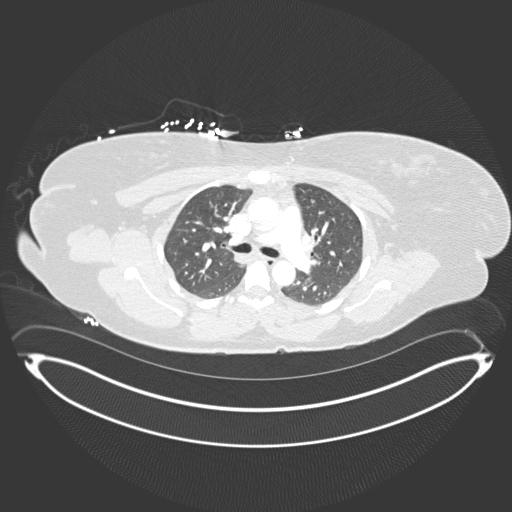
[im 212/275  soft-tissue]
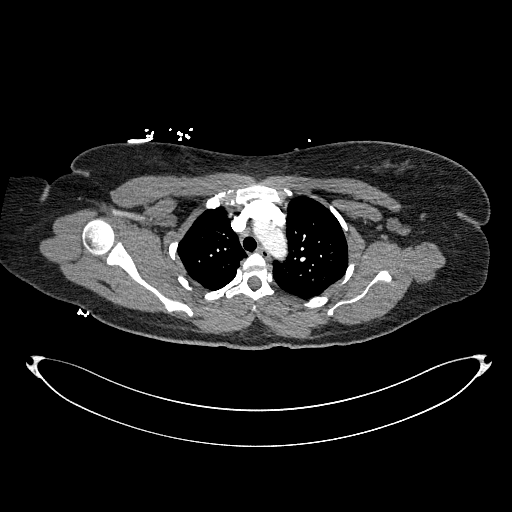
[im 225/275  lung]
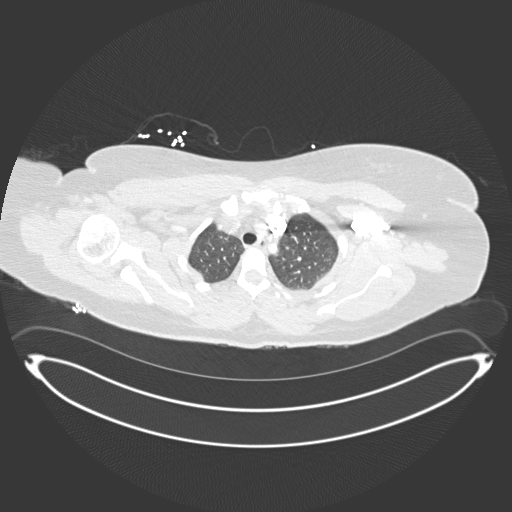
[im 237/275  soft-tissue]
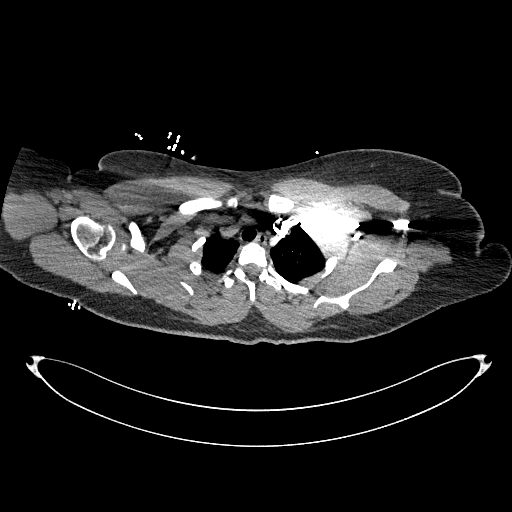
[im 262/275  lung]
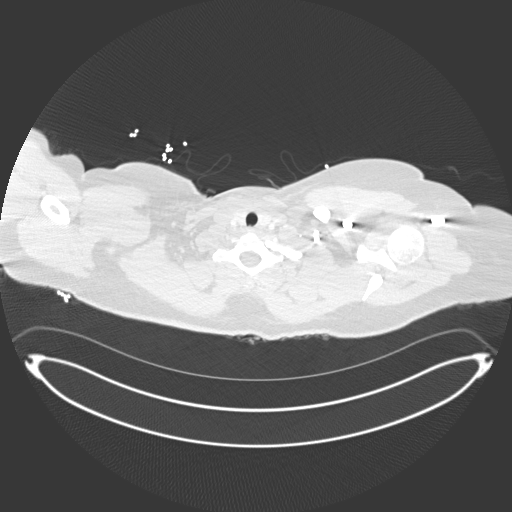

[Series 8: coronal mpr · coronal · 0.55mm/px · 3 of 132 slices shown]
[im 33/132  soft-tissue]
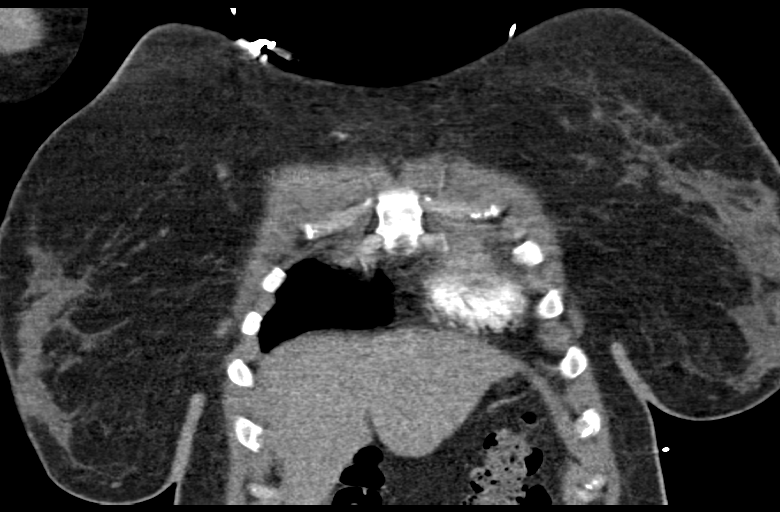
[im 66/132  soft-tissue]
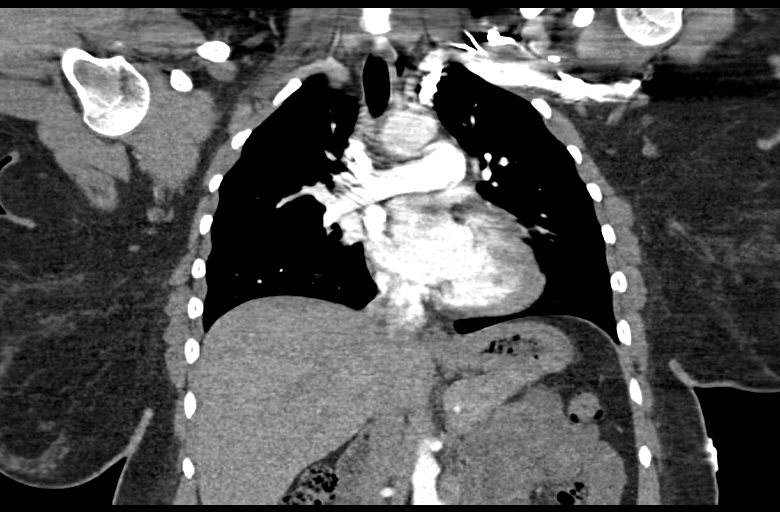
[im 99/132  soft-tissue]
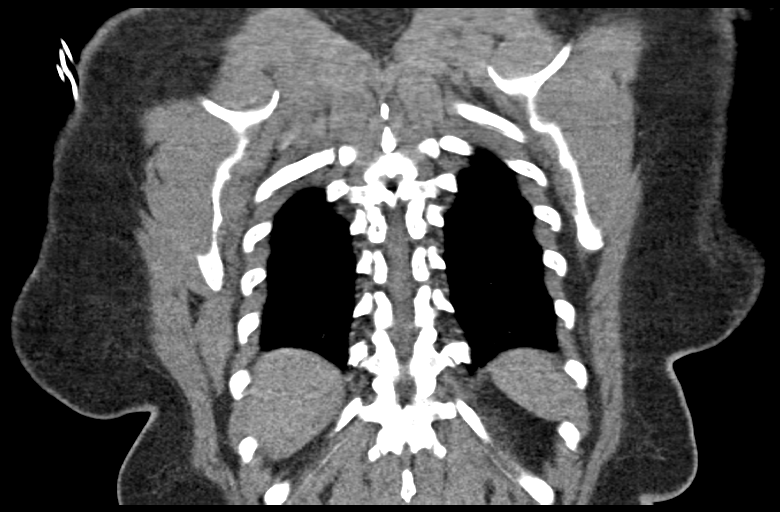

[18 of 46 positions shown; findings below may reference images not displayed]

FINDINGS: Cardiovascular: The study is of quality for the evaluation of
pulmonary embolism. There are no filling defects in the central,
lobar, segmental or subsegmental pulmonary artery branches to
suggest acute pulmonary embolism. Great vessels are normal in course
and caliber. Normal heart size. No significant pericardial
fluid/thickening. Nonaneurysmal thoracic aorta. No evidence of
aortic dissection.

Mediastinum/Nodes: No discrete thyroid nodules. Unremarkable
esophagus. No pathologically enlarged axillary, mediastinal or hilar
lymph nodes.

Lungs/Pleura: No pneumothorax. No pleural effusion. No pulmonary
consolidations or dominant mass.

Upper abdomen: Unremarkable.

Musculoskeletal:  No aggressive appearing focal osseous lesions.

Review of the MIP images confirms the above findings.
IMPRESSION: 1. No acute pulmonary embolus.
2. No evidence of aortic dissection.
3. No active pulmonary disease.

## 2020-02-08 DIAGNOSIS — Z3481 Encounter for supervision of other normal pregnancy, first trimester: Secondary | ICD-10-CM | POA: Diagnosis not present

## 2020-02-08 DIAGNOSIS — Z3490 Encounter for supervision of normal pregnancy, unspecified, unspecified trimester: Secondary | ICD-10-CM | POA: Diagnosis not present

## 2020-02-08 DIAGNOSIS — Z3A18 18 weeks gestation of pregnancy: Secondary | ICD-10-CM | POA: Diagnosis not present

## 2020-02-08 DIAGNOSIS — Z3482 Encounter for supervision of other normal pregnancy, second trimester: Secondary | ICD-10-CM | POA: Diagnosis not present

## 2020-03-07 DIAGNOSIS — Z3482 Encounter for supervision of other normal pregnancy, second trimester: Secondary | ICD-10-CM | POA: Diagnosis not present

## 2020-03-07 DIAGNOSIS — Z3A18 18 weeks gestation of pregnancy: Secondary | ICD-10-CM | POA: Diagnosis not present

## 2020-04-06 DIAGNOSIS — Z20828 Contact with and (suspected) exposure to other viral communicable diseases: Secondary | ICD-10-CM | POA: Diagnosis not present

## 2020-04-11 DIAGNOSIS — Z36 Encounter for antenatal screening for chromosomal anomalies: Secondary | ICD-10-CM | POA: Diagnosis not present

## 2020-04-11 DIAGNOSIS — Z3490 Encounter for supervision of normal pregnancy, unspecified, unspecified trimester: Secondary | ICD-10-CM | POA: Diagnosis not present

## 2020-04-11 DIAGNOSIS — Z3A18 18 weeks gestation of pregnancy: Secondary | ICD-10-CM | POA: Diagnosis not present

## 2020-04-11 DIAGNOSIS — Z3482 Encounter for supervision of other normal pregnancy, second trimester: Secondary | ICD-10-CM | POA: Diagnosis not present

## 2020-06-01 DIAGNOSIS — Z3482 Encounter for supervision of other normal pregnancy, second trimester: Secondary | ICD-10-CM | POA: Diagnosis not present

## 2020-06-01 DIAGNOSIS — Z3A25 25 weeks gestation of pregnancy: Secondary | ICD-10-CM | POA: Diagnosis not present

## 2020-06-01 DIAGNOSIS — Z3689 Encounter for other specified antenatal screening: Secondary | ICD-10-CM | POA: Diagnosis not present

## 2020-06-10 ENCOUNTER — Ambulatory Visit: Payer: 59 | Attending: Internal Medicine

## 2020-06-10 DIAGNOSIS — Z23 Encounter for immunization: Secondary | ICD-10-CM

## 2020-06-10 NOTE — Progress Notes (Signed)
   [facility]  Name:  Linda Cole    MRN: [id number] DOB: 1986-07-18  06/10/2020  [name] was observed post Covid-19 immunization for 15 minutes without incident. She was provided with Vaccine Information Sheet and instruction to access the V-Safe system.   [name] was instructed to call 911 with any severe reactions post vaccine: Marland Kitchen Difficulty breathing  . Swelling of face and throat  . A fast heartbeat  . A bad rash all over body  . Dizziness and weakness

## 2020-07-17 DIAGNOSIS — O10012 Pre-existing essential hypertension complicating pregnancy, second trimester: Secondary | ICD-10-CM | POA: Diagnosis not present

## 2020-07-28 DIAGNOSIS — O163 Unspecified maternal hypertension, third trimester: Secondary | ICD-10-CM | POA: Diagnosis not present

## 2020-07-28 DIAGNOSIS — R030 Elevated blood-pressure reading, without diagnosis of hypertension: Secondary | ICD-10-CM | POA: Diagnosis not present

## 2020-07-28 DIAGNOSIS — Z3A34 34 weeks gestation of pregnancy: Secondary | ICD-10-CM | POA: Diagnosis not present

## 2020-07-28 DIAGNOSIS — Z7982 Long term (current) use of aspirin: Secondary | ICD-10-CM | POA: Diagnosis not present

## 2020-07-28 DIAGNOSIS — O26893 Other specified pregnancy related conditions, third trimester: Secondary | ICD-10-CM | POA: Diagnosis not present

## 2020-07-31 DIAGNOSIS — O133 Gestational [pregnancy-induced] hypertension without significant proteinuria, third trimester: Secondary | ICD-10-CM | POA: Diagnosis not present

## 2020-08-05 DIAGNOSIS — Z3A35 35 weeks gestation of pregnancy: Secondary | ICD-10-CM | POA: Diagnosis not present

## 2020-08-05 DIAGNOSIS — O26893 Other specified pregnancy related conditions, third trimester: Secondary | ICD-10-CM | POA: Diagnosis not present

## 2020-08-05 DIAGNOSIS — O163 Unspecified maternal hypertension, third trimester: Secondary | ICD-10-CM | POA: Diagnosis not present

## 2020-08-05 DIAGNOSIS — O133 Gestational [pregnancy-induced] hypertension without significant proteinuria, third trimester: Secondary | ICD-10-CM | POA: Diagnosis not present

## 2020-08-05 DIAGNOSIS — R519 Headache, unspecified: Secondary | ICD-10-CM | POA: Diagnosis not present

## 2020-08-10 DIAGNOSIS — O358XX0 Maternal care for other (suspected) fetal abnormality and damage, not applicable or unspecified: Secondary | ICD-10-CM | POA: Diagnosis not present

## 2020-08-10 DIAGNOSIS — Z3A35 35 weeks gestation of pregnancy: Secondary | ICD-10-CM | POA: Diagnosis not present

## 2020-08-10 DIAGNOSIS — O133 Gestational [pregnancy-induced] hypertension without significant proteinuria, third trimester: Secondary | ICD-10-CM | POA: Diagnosis not present

## 2020-08-10 DIAGNOSIS — O365930 Maternal care for other known or suspected poor fetal growth, third trimester, not applicable or unspecified: Secondary | ICD-10-CM | POA: Diagnosis not present

## 2020-08-15 DIAGNOSIS — Z3490 Encounter for supervision of normal pregnancy, unspecified, unspecified trimester: Secondary | ICD-10-CM | POA: Diagnosis not present

## 2020-08-15 DIAGNOSIS — O133 Gestational [pregnancy-induced] hypertension without significant proteinuria, third trimester: Secondary | ICD-10-CM | POA: Diagnosis not present

## 2020-08-17 DIAGNOSIS — O365930 Maternal care for other known or suspected poor fetal growth, third trimester, not applicable or unspecified: Secondary | ICD-10-CM | POA: Diagnosis not present

## 2020-08-17 DIAGNOSIS — O133 Gestational [pregnancy-induced] hypertension without significant proteinuria, third trimester: Secondary | ICD-10-CM | POA: Diagnosis not present

## 2020-08-18 DIAGNOSIS — Z3A37 37 weeks gestation of pregnancy: Secondary | ICD-10-CM | POA: Diagnosis not present

## 2020-08-18 DIAGNOSIS — O134 Gestational [pregnancy-induced] hypertension without significant proteinuria, complicating childbirth: Secondary | ICD-10-CM | POA: Diagnosis not present

## 2020-08-18 DIAGNOSIS — O365930 Maternal care for other known or suspected poor fetal growth, third trimester, not applicable or unspecified: Secondary | ICD-10-CM | POA: Diagnosis not present

## 2020-08-18 DIAGNOSIS — Z87410 Personal history of cervical dysplasia: Secondary | ICD-10-CM | POA: Diagnosis not present

## 2020-08-18 DIAGNOSIS — Z8759 Personal history of other complications of pregnancy, childbirth and the puerperium: Secondary | ICD-10-CM | POA: Diagnosis not present

## 2020-08-25 DIAGNOSIS — O165 Unspecified maternal hypertension, complicating the puerperium: Secondary | ICD-10-CM | POA: Diagnosis not present

## 2020-08-25 DIAGNOSIS — Z833 Family history of diabetes mellitus: Secondary | ICD-10-CM | POA: Diagnosis not present

## 2020-08-25 DIAGNOSIS — Z803 Family history of malignant neoplasm of breast: Secondary | ICD-10-CM | POA: Diagnosis not present

## 2020-08-25 DIAGNOSIS — Z8249 Family history of ischemic heart disease and other diseases of the circulatory system: Secondary | ICD-10-CM | POA: Diagnosis not present

## 2020-08-25 DIAGNOSIS — R109 Unspecified abdominal pain: Secondary | ICD-10-CM | POA: Diagnosis not present

## 2020-08-25 DIAGNOSIS — I10 Essential (primary) hypertension: Secondary | ICD-10-CM | POA: Diagnosis not present

## 2020-08-25 DIAGNOSIS — Z7982 Long term (current) use of aspirin: Secondary | ICD-10-CM | POA: Diagnosis not present

## 2020-08-25 DIAGNOSIS — E876 Hypokalemia: Secondary | ICD-10-CM | POA: Diagnosis not present

## 2020-08-25 DIAGNOSIS — Z8759 Personal history of other complications of pregnancy, childbirth and the puerperium: Secondary | ICD-10-CM | POA: Diagnosis not present

## 2020-08-25 DIAGNOSIS — Z882 Allergy status to sulfonamides status: Secondary | ICD-10-CM | POA: Diagnosis not present

## 2020-08-28 DIAGNOSIS — O135 Gestational [pregnancy-induced] hypertension without significant proteinuria, complicating the puerperium: Secondary | ICD-10-CM | POA: Diagnosis not present

## 2020-11-15 DIAGNOSIS — H04559 Acquired stenosis of unspecified nasolacrimal duct: Secondary | ICD-10-CM | POA: Diagnosis not present

## 2020-11-15 DIAGNOSIS — H04229 Epiphora due to insufficient drainage, unspecified lacrimal gland: Secondary | ICD-10-CM | POA: Diagnosis not present

## 2020-12-18 DIAGNOSIS — Z841 Family history of disorders of kidney and ureter: Secondary | ICD-10-CM | POA: Diagnosis not present

## 2020-12-18 DIAGNOSIS — R030 Elevated blood-pressure reading, without diagnosis of hypertension: Secondary | ICD-10-CM | POA: Diagnosis not present

## 2020-12-18 DIAGNOSIS — E876 Hypokalemia: Secondary | ICD-10-CM | POA: Diagnosis not present

## 2020-12-18 DIAGNOSIS — M545 Low back pain, unspecified: Secondary | ICD-10-CM | POA: Diagnosis not present

## 2020-12-18 DIAGNOSIS — K219 Gastro-esophageal reflux disease without esophagitis: Secondary | ICD-10-CM | POA: Diagnosis not present

## 2020-12-18 DIAGNOSIS — E6609 Other obesity due to excess calories: Secondary | ICD-10-CM | POA: Diagnosis not present

## 2020-12-18 DIAGNOSIS — Z6834 Body mass index (BMI) 34.0-34.9, adult: Secondary | ICD-10-CM | POA: Diagnosis not present

## 2020-12-18 DIAGNOSIS — Z13220 Encounter for screening for lipoid disorders: Secondary | ICD-10-CM | POA: Diagnosis not present

## 2020-12-18 DIAGNOSIS — Z136 Encounter for screening for cardiovascular disorders: Secondary | ICD-10-CM | POA: Diagnosis not present

## 2020-12-18 DIAGNOSIS — E039 Hypothyroidism, unspecified: Secondary | ICD-10-CM | POA: Diagnosis not present

## 2021-05-08 DIAGNOSIS — E6609 Other obesity due to excess calories: Secondary | ICD-10-CM | POA: Diagnosis not present

## 2021-05-08 DIAGNOSIS — J029 Acute pharyngitis, unspecified: Secondary | ICD-10-CM | POA: Diagnosis not present

## 2021-05-08 DIAGNOSIS — Z6834 Body mass index (BMI) 34.0-34.9, adult: Secondary | ICD-10-CM | POA: Diagnosis not present

## 2021-05-08 DIAGNOSIS — Z841 Family history of disorders of kidney and ureter: Secondary | ICD-10-CM | POA: Diagnosis not present

## 2021-05-08 DIAGNOSIS — R030 Elevated blood-pressure reading, without diagnosis of hypertension: Secondary | ICD-10-CM | POA: Diagnosis not present

## 2021-05-25 DIAGNOSIS — Z23 Encounter for immunization: Secondary | ICD-10-CM | POA: Diagnosis not present

## 2021-06-27 DIAGNOSIS — Z833 Family history of diabetes mellitus: Secondary | ICD-10-CM | POA: Diagnosis not present

## 2021-06-27 DIAGNOSIS — Z841 Family history of disorders of kidney and ureter: Secondary | ICD-10-CM | POA: Diagnosis not present

## 2021-06-27 DIAGNOSIS — E6609 Other obesity due to excess calories: Secondary | ICD-10-CM | POA: Diagnosis not present

## 2021-06-27 DIAGNOSIS — Z6834 Body mass index (BMI) 34.0-34.9, adult: Secondary | ICD-10-CM | POA: Diagnosis not present

## 2021-06-27 DIAGNOSIS — R030 Elevated blood-pressure reading, without diagnosis of hypertension: Secondary | ICD-10-CM | POA: Diagnosis not present

## 2021-12-31 DIAGNOSIS — N920 Excessive and frequent menstruation with regular cycle: Secondary | ICD-10-CM | POA: Diagnosis not present

## 2021-12-31 DIAGNOSIS — Z01411 Encounter for gynecological examination (general) (routine) with abnormal findings: Secondary | ICD-10-CM | POA: Diagnosis not present

## 2022-01-14 DIAGNOSIS — N920 Excessive and frequent menstruation with regular cycle: Secondary | ICD-10-CM | POA: Diagnosis not present

## 2022-01-14 DIAGNOSIS — Z01419 Encounter for gynecological examination (general) (routine) without abnormal findings: Secondary | ICD-10-CM | POA: Diagnosis not present

## 2022-01-14 DIAGNOSIS — Z3009 Encounter for other general counseling and advice on contraception: Secondary | ICD-10-CM | POA: Diagnosis not present

## 2022-07-17 DIAGNOSIS — Z833 Family history of diabetes mellitus: Secondary | ICD-10-CM | POA: Diagnosis not present

## 2022-07-17 DIAGNOSIS — Z841 Family history of disorders of kidney and ureter: Secondary | ICD-10-CM | POA: Diagnosis not present

## 2022-07-17 DIAGNOSIS — E6609 Other obesity due to excess calories: Secondary | ICD-10-CM | POA: Diagnosis not present

## 2022-07-17 DIAGNOSIS — N920 Excessive and frequent menstruation with regular cycle: Secondary | ICD-10-CM | POA: Diagnosis not present

## 2022-07-17 DIAGNOSIS — Z6837 Body mass index (BMI) 37.0-37.9, adult: Secondary | ICD-10-CM | POA: Diagnosis not present

## 2022-07-17 DIAGNOSIS — Z1329 Encounter for screening for other suspected endocrine disorder: Secondary | ICD-10-CM | POA: Diagnosis not present

## 2022-07-17 DIAGNOSIS — Z0000 Encounter for general adult medical examination without abnormal findings: Secondary | ICD-10-CM | POA: Diagnosis not present
